# Patient Record
Sex: Male | Born: 1970 | Hispanic: No | Marital: Married | Smoking: Current some day smoker
Health system: Southern US, Community
[De-identification: ages and names within clinical notes are randomized; demographics above are authoritative.]

## PROBLEM LIST (undated history)

## (undated) DIAGNOSIS — E785 Hyperlipidemia, unspecified: Secondary | ICD-10-CM

## (undated) DIAGNOSIS — K219 Gastro-esophageal reflux disease without esophagitis: Secondary | ICD-10-CM

## (undated) DIAGNOSIS — K2970 Gastritis, unspecified, without bleeding: Secondary | ICD-10-CM

## (undated) DIAGNOSIS — H9319 Tinnitus, unspecified ear: Secondary | ICD-10-CM

## (undated) DIAGNOSIS — N529 Male erectile dysfunction, unspecified: Secondary | ICD-10-CM

## (undated) DIAGNOSIS — R9431 Abnormal electrocardiogram [ECG] [EKG]: Secondary | ICD-10-CM

## (undated) DIAGNOSIS — Z8249 Family history of ischemic heart disease and other diseases of the circulatory system: Secondary | ICD-10-CM

## (undated) DIAGNOSIS — M545 Low back pain, unspecified: Secondary | ICD-10-CM

## (undated) DIAGNOSIS — R739 Hyperglycemia, unspecified: Secondary | ICD-10-CM

## (undated) HISTORY — DX: Hyperglycemia, unspecified: R73.9

## (undated) HISTORY — DX: Gastritis, unspecified, without bleeding: K29.70

## (undated) HISTORY — DX: Male erectile dysfunction, unspecified: N52.9

## (undated) HISTORY — DX: Hyperlipidemia, unspecified: E78.5

## (undated) HISTORY — PX: TONSILLECTOMY: SUR1361

## (undated) HISTORY — DX: Gastro-esophageal reflux disease without esophagitis: K21.9

## (undated) HISTORY — DX: Family history of ischemic heart disease and other diseases of the circulatory system: Z82.49

## (undated) HISTORY — DX: Abnormal electrocardiogram (ECG) (EKG): R94.31

## (undated) HISTORY — DX: Low back pain, unspecified: M54.50

## (undated) HISTORY — DX: Tinnitus, unspecified ear: H93.19

---

## 2004-12-27 ENCOUNTER — Ambulatory Visit: Payer: Self-pay | Admitting: Internal Medicine

## 2005-08-22 ENCOUNTER — Ambulatory Visit: Payer: Self-pay | Admitting: Internal Medicine

## 2006-04-19 ENCOUNTER — Ambulatory Visit: Payer: Self-pay | Admitting: Internal Medicine

## 2006-05-16 ENCOUNTER — Ambulatory Visit: Payer: Self-pay | Admitting: Internal Medicine

## 2007-07-12 ENCOUNTER — Ambulatory Visit: Payer: Self-pay | Admitting: Family Medicine

## 2007-08-05 ENCOUNTER — Ambulatory Visit: Payer: Self-pay | Admitting: Internal Medicine

## 2007-08-05 DIAGNOSIS — K219 Gastro-esophageal reflux disease without esophagitis: Secondary | ICD-10-CM

## 2007-08-07 ENCOUNTER — Telehealth: Payer: Self-pay | Admitting: Internal Medicine

## 2007-08-08 ENCOUNTER — Encounter (INDEPENDENT_AMBULATORY_CARE_PROVIDER_SITE_OTHER): Payer: Self-pay | Admitting: *Deleted

## 2007-08-30 ENCOUNTER — Ambulatory Visit: Payer: Self-pay | Admitting: Gastroenterology

## 2007-09-02 ENCOUNTER — Encounter: Payer: Self-pay | Admitting: Internal Medicine

## 2007-09-02 ENCOUNTER — Encounter: Payer: Self-pay | Admitting: Gastroenterology

## 2007-09-02 ENCOUNTER — Ambulatory Visit: Payer: Self-pay | Admitting: Gastroenterology

## 2007-09-06 ENCOUNTER — Encounter: Payer: Self-pay | Admitting: Gastroenterology

## 2007-09-10 ENCOUNTER — Telehealth: Payer: Self-pay | Admitting: Gastroenterology

## 2009-03-01 ENCOUNTER — Ambulatory Visit: Payer: Self-pay | Admitting: Internal Medicine

## 2009-03-05 ENCOUNTER — Ambulatory Visit: Payer: Self-pay | Admitting: Internal Medicine

## 2009-03-08 LAB — CONVERTED CEMR LAB
ALT: 34 units/L (ref 0–53)
Basophils Absolute: 0 10*3/uL (ref 0.0–0.1)
Calcium: 9.1 mg/dL (ref 8.4–10.5)
Cholesterol: 215 mg/dL — ABNORMAL HIGH (ref 0–200)
Creatinine, Ser: 1.1 mg/dL (ref 0.4–1.5)
Direct LDL: 149.1 mg/dL
Eosinophils Absolute: 0.1 10*3/uL (ref 0.0–0.7)
HCT: 43 % (ref 39.0–52.0)
HDL: 37.8 mg/dL — ABNORMAL LOW (ref >39.00)
Hemoglobin: 14.6 g/dL (ref 13.0–17.0)
Lymphs Abs: 2.2 10*3/uL (ref 0.7–4.0)
MCHC: 33.9 g/dL (ref 30.0–36.0)
MCV: 85.6 fL (ref 78.0–100.0)
Neutro Abs: 2 10*3/uL (ref 1.4–7.7)
RDW: 11.8 % (ref 11.5–14.6)
TSH: 1.29 microintl units/mL (ref 0.35–5.50)
Triglycerides: 132 mg/dL (ref 0.0–149.0)

## 2010-06-07 NOTE — Progress Notes (Signed)
Summary: Pt has question about medicine he takes for H.Pylori  Phone Note Call from Patient Call back at [phone]   Call For: Arlyce Dice Complaint: Chest Pain Summary of Call: Pt has questions about medicine he takes for bacteria for h.pylori. Pt has bad taste in mouth constantly. Patient's chart has been requested.  Initial call taken by: Verdell Face,  Sep 10, 2007 2:01 PM  Follow-up for Phone Call        Mesage left for patient to callback. Laureen Ochs LPN  Sep 11, 4538 11:36 AM  Mesage left for patient to callback. Laureen Ochs LPN  Sep 11, 9809 11:58 AM   Additional Follow-up for Phone Call Additional follow up Details #1::        This could be from the prevpak.  Continue if he can tolerate it. Additional Follow-up by: Louis Meckel MD,  Sep 12, 2007 3:48 PM    Additional Follow-up for Phone Call Additional follow up Details #2::    1) Pt. will complete prevpac. 2) Pt. instructed to call back as needed.  Follow-up by: Laureen Ochs LPN,  Sep 11, 9145 4:03 PM

## 2010-07-19 ENCOUNTER — Emergency Department (HOSPITAL_BASED_OUTPATIENT_CLINIC_OR_DEPARTMENT_OTHER)
Admission: EM | Admit: 2010-07-19 | Discharge: 2010-07-19 | Disposition: A | Discharge status: Home / Self Care | Payer: BC Managed Care – PPO | Attending: Emergency Medicine | Admitting: Emergency Medicine

## 2010-07-19 ENCOUNTER — Emergency Department (INDEPENDENT_AMBULATORY_CARE_PROVIDER_SITE_OTHER): Payer: BC Managed Care – PPO

## 2010-07-19 DIAGNOSIS — Y92009 Unspecified place in unspecified non-institutional (private) residence as the place of occurrence of the external cause: Secondary | ICD-10-CM | POA: Insufficient documentation

## 2010-07-19 DIAGNOSIS — S61409A Unspecified open wound of unspecified hand, initial encounter: Secondary | ICD-10-CM | POA: Insufficient documentation

## 2010-07-19 DIAGNOSIS — W268XXA Contact with other sharp object(s), not elsewhere classified, initial encounter: Secondary | ICD-10-CM

## 2010-07-20 ENCOUNTER — Encounter: Payer: Self-pay | Admitting: Internal Medicine

## 2010-07-20 ENCOUNTER — Ambulatory Visit: Payer: BC Managed Care – PPO | Admitting: Internal Medicine

## 2010-07-20 ENCOUNTER — Ambulatory Visit (INDEPENDENT_AMBULATORY_CARE_PROVIDER_SITE_OTHER): Payer: BC Managed Care – PPO | Admitting: Internal Medicine

## 2010-07-20 DIAGNOSIS — S4980XA Other specified injuries of shoulder and upper arm, unspecified arm, initial encounter: Secondary | ICD-10-CM | POA: Insufficient documentation

## 2010-07-25 ENCOUNTER — Encounter: Payer: Self-pay | Admitting: Internal Medicine

## 2010-07-25 ENCOUNTER — Ambulatory Visit (INDEPENDENT_AMBULATORY_CARE_PROVIDER_SITE_OTHER): Payer: BC Managed Care – PPO | Admitting: Internal Medicine

## 2010-07-25 DIAGNOSIS — S4980XA Other specified injuries of shoulder and upper arm, unspecified arm, initial encounter: Secondary | ICD-10-CM

## 2010-07-26 ENCOUNTER — Encounter: Payer: Self-pay | Admitting: Internal Medicine

## 2010-07-26 NOTE — Miscellaneous (Signed)
Summary: Orders Update  Clinical Lists Changes  Orders: Added new Service order of Est. Patient Level II (99212) - Signed 

## 2010-07-26 NOTE — Assessment & Plan Note (Signed)
Summary: Injured lt hand yesterday, went to ER, wants a doctor to look...   Vital Signs:  Patient profile:   40 year old male Weight:      168.6 pounds BMI:     24.99 Temp:     98.3 degrees F oral Pulse rate:   84 / minute Resp:     13 per minute BP sitting:   140 / 80  (right arm) Cuff size:   regular  Vitals Entered By: Shonna Chock CMA (July 20, 2010 10:43 AM) CC: Injured left hand, seen @  the Med center last night   Primary Care Provider:  Drue Novel  CC:  Injured left hand and seen @  the Med center last night.  History of Present Illness:    He cut his LUE on broken glass from storm window. Sutures  & tetanus shot completed in ER  last night about 6 pm 7 antibiotics Rxed. He is to change dressing two times a day ; this am he could  not get dressing off due to adhesion.  Current Medications (verified): 1)  Nexium 40 Mg  Cpdr (Esomeprazole Magnesium) .Marland Kitchen.. 1 By Mouth Before Breakfast (Off/on)  Allergies (verified): No Known Drug Allergies  Review of Systems General:  Denies chills, fever, and sweats.  Physical Exam  General:  well-nourished,in no acute distress; alert,appropriate and cooperative throughout examination Skin:  20X 16  mm clean abrasion  L forearm ; sutured  wound (5.5 cm) clean  Axillary Nodes:  No palpable lymphadenopathy LUE   Impression & Recommendations:  Problem # 1:  ARM INJURY (ICD-959.2) laceration & abrasion  Complete Medication List: 1)  Nexium 40 Mg Cpdr (Esomeprazole magnesium) .Marland Kitchen.. 1 by mouth before breakfast (off/on)  Patient Instructions: 1)  Report "pain , pus , fever & red streaks". Wet to almost dry saline only as needed for inflammation . Cover wounds with Telfa dressing.

## 2010-07-27 ENCOUNTER — Ambulatory Visit (INDEPENDENT_AMBULATORY_CARE_PROVIDER_SITE_OTHER): Payer: BC Managed Care – PPO | Admitting: Internal Medicine

## 2010-07-27 ENCOUNTER — Encounter: Payer: Self-pay | Admitting: Internal Medicine

## 2010-07-27 VITALS — BP 124/80 | HR 78 | Wt 169.4 lb

## 2010-07-27 DIAGNOSIS — S4980XA Other specified injuries of shoulder and upper arm, unspecified arm, initial encounter: Secondary | ICD-10-CM

## 2010-07-27 MED ORDER — MUPIROCIN 2 % EX OINT: TOPICAL_OINTMENT | CUTANEOUS | Status: DC

## 2010-07-27 MED ORDER — DOXYCYCLINE HYCLATE 100 MG PO CAPS: 100.0000 mg | ORAL_CAPSULE | Freq: Two times a day (BID) | ORAL | Status: AC

## 2010-07-27 NOTE — Progress Notes (Signed)
  Subjective:    Patient ID: Scott Sexton, male    DOB: 07-23-1970, 40 y.o.   MRN: [id number]  HPI  Here for suture removal No reported problems   Review of Systems     Objective:   Physical Exam On inspection, the L hand wound seemed fine, we removed all sutures, in the process we noted few drops of purulent-blood  d/c . Surrounding areas are free of cellulitis or crepitus        Assessment & Plan:

## 2010-07-27 NOTE — Patient Instructions (Addendum)
Keep it clean, use mupirocin  twice a day Doxycycline as prescribed Came back in 1 week Call if redness,  swelling, fever

## 2010-07-27 NOTE — Assessment & Plan Note (Addendum)
Wound is infected, pt had a  Td few days ago It was irrigated w/ H2O2 and covered  The wound is open but anticipate will heal slowly See instructions

## 2010-07-29 ENCOUNTER — Ambulatory Visit (INDEPENDENT_AMBULATORY_CARE_PROVIDER_SITE_OTHER): Payer: BC Managed Care – PPO | Admitting: Internal Medicine

## 2010-07-29 ENCOUNTER — Encounter: Payer: Self-pay | Admitting: Internal Medicine

## 2010-07-29 DIAGNOSIS — L089 Local infection of the skin and subcutaneous tissue, unspecified: Secondary | ICD-10-CM

## 2010-07-29 NOTE — Assessment & Plan Note (Signed)
Overall I think he is doing well, no evidence of further infection. I think he is responding well to the treatment. Again, I asked him to call if there is any redness or discharge. Otherwise plan is the same, see previous note

## 2010-07-29 NOTE — Progress Notes (Signed)
  Subjective:    Patient ID: Scott Sexton, male    DOB: 1970/12/02, 40 y.o.   MRN: [id number]  HPI  last night he developed a throbbing pain on the palmar aspect of the left hand   Review of Systems  no fever No further discharge from the area No redness He is following very closely the instructions for wound care    Objective:   Physical Exam  alert oriented in on apparent distress Left arm  : Wounds proximal to the wrist are healing well  large laceration at the side of the hand looks dry, no swollen, no discharge.  inspection of the palmar aspect of the hand showed no abnormalities, swelling, pain, redness.       Assessment & Plan:

## 2010-07-30 ENCOUNTER — Encounter: Payer: Self-pay | Admitting: Internal Medicine

## 2010-08-01 ENCOUNTER — Ambulatory Visit: Payer: BC Managed Care – PPO | Admitting: Internal Medicine

## 2010-08-03 ENCOUNTER — Ambulatory Visit (INDEPENDENT_AMBULATORY_CARE_PROVIDER_SITE_OTHER): Payer: BC Managed Care – PPO | Admitting: Internal Medicine

## 2010-08-03 ENCOUNTER — Encounter: Payer: Self-pay | Admitting: Internal Medicine

## 2010-08-03 DIAGNOSIS — T148XXA Other injury of unspecified body region, initial encounter: Secondary | ICD-10-CM

## 2010-08-03 NOTE — Progress Notes (Signed)
  Subjective:    Patient ID: Scott Sexton, male    DOB: 05-Jun-1970, 40 y.o.   MRN: [id number]  HPI  good medication compliance w/ abx Wound healing well, no d/c    Review of Systems     Objective:   Physical Exam wounds @ the L arm healing w/o apparent problems, no d/c , no redness, good granulation tissue        Assessment & Plan:

## 2010-08-03 NOTE — Assessment & Plan Note (Signed)
Improving Finish po abx Call again if s/s of infection F/u prn otherwise

## 2010-08-04 NOTE — Assessment & Plan Note (Signed)
Summary: remove stitches -went to [facility]   Vital Signs:  Patient profile:   40 year old male Weight:      169.50 pounds Pulse rate:   76 / minute Pulse rhythm:   regular BP sitting:   124 / 84  (left arm) Cuff size:   regular  Vitals Entered By: Army Fossa CMA (July 25, 2010 3:40 PM) CC: Remove stitches from (L) hand Comments currently on ATB unsure of name [facility] [address]    History of Present Illness:  went to the ER 3-13 with a laceration He was sutured up,  they gave a tetanus shot and antibiotics Here for stitch removal  Review of systems Doing well Good medication compliance with antibiotics  pain is manageable No discharge  no problems with moving his fingers  Current Medications (verified): 1)  Nexium 40 Mg  Cpdr (Esomeprazole Magnesium) .Marland Kitchen.. 1 By Mouth Before Breakfast (Off/on)  Allergies (verified): No Known Drug Allergies  Past History:  Past Medical History: Reviewed history from 03/01/2009 and no changes required. GERD EGD 08/2007 : Gastritis, H. pylori positive, status post treatment  Past Surgical History: Reviewed history from 07/12/2007 and no changes required. Tonsillectomy  Physical Exam  General:  alert and well-developed.  alert and well-developed.   Msk:  motor exam hand wnl  Extremities:   the ulnar aspect of the left hand he has a 10 cm laceration with several stitches. No evidence of infection. Proximally he has several excoriations, no evidence of cellulitis. No foreign bodies noted.   Impression & Recommendations:  Problem # 1:  ARM INJURY (ICD-959.2)  seems to be healing  well  Will keep stitches in for 2 additional days, see instructions.  Complete Medication List: 1)  Nexium 40 Mg Cpdr (Esomeprazole magnesium) .Marland Kitchen.. 1 by mouth before breakfast (off/on)  Patient Instructions: 1)  patient needs an appointment for Wednesday afternoon (07-27-10) Prescriptions: NEXIUM 40 MG  CPDR (ESOMEPRAZOLE MAGNESIUM) 1 by  mouth before breakfast (off/on)  #30 x 1   Entered by:   Army Fossa CMA   Authorized by:   Nolon Rod. Paz MD   Signed by:   Army Fossa CMA on 07/25/2010   Method used:   Electronically to        [id number] (retail)       [address]       [place], [place]  [address]       Ph: [phone]       Fax: [phone]   RxID:   [id number]    Orders Added: 1)  Est. Patient Level III [44034]   Immunization History:  Tetanus/Td Immunization History:    Tetanus/Td:  at the er per pt  (07/19/2010)   Immunization History:  Tetanus/Td Immunization History:    Tetanus/Td:  at the ER per pt  (07/19/2010)

## 2010-09-09 ENCOUNTER — Encounter: Payer: Self-pay | Admitting: Internal Medicine

## 2010-09-09 ENCOUNTER — Ambulatory Visit (INDEPENDENT_AMBULATORY_CARE_PROVIDER_SITE_OTHER): Payer: BC Managed Care – PPO | Admitting: Internal Medicine

## 2010-09-09 VITALS — BP 122/76 | HR 92 | Temp 98.2°F | Wt 170.0 lb

## 2010-09-09 DIAGNOSIS — J029 Acute pharyngitis, unspecified: Secondary | ICD-10-CM

## 2010-09-09 NOTE — Assessment & Plan Note (Addendum)
Resolving pharyngitis. no fever, neck LADs, neg strep test. Plan: conservative treatment. See instructions

## 2010-09-09 NOTE — Patient Instructions (Signed)
Rest, fluids , tylenol Zyrtec x few days  Call anytime if the symptoms are severe, you have high fever, more sore throat

## 2010-09-09 NOTE — Progress Notes (Signed)
  Subjective:    Patient ID: Scott Sexton, male    DOB: 23-Dec-1970, 40 y.o.   MRN: [id number]  HPI  ST x 1 week, decided to take zyrtec yesterday and feels better today  Past Medical History  Diagnosis Date  . GERD (gastroesophageal reflux disease)   . Gastritis     EGD 08/2007, H.Pylori positive, status post treatment   Past Surgical History  Procedure Date  . Tonsillectomy      Review of Systems No fever-chills No cough, chest congestion No RN or sinus congestion    Objective:   Physical Exam  Constitutional: He appears well-developed and well-nourished.  HENT:  Head: Normocephalic and atraumatic.  Right Ear: External ear normal.  Left Ear: External ear normal.  Nose: Nose normal.  Mouth/Throat: Oropharynx is clear and moist. No oropharyngeal exudate.  Eyes: EOM are normal.  Neck: Normal range of motion. Neck supple.  Cardiovascular: Normal rate, regular rhythm and normal heart sounds.   No murmur heard. Pulmonary/Chest: Effort normal and breath sounds normal. No respiratory distress. He has no wheezes. He has no rales.  Lymphadenopathy:    He has no cervical adenopathy.          Assessment & Plan:

## 2010-09-21 ENCOUNTER — Telehealth: Payer: Self-pay

## 2010-09-21 ENCOUNTER — Ambulatory Visit (INDEPENDENT_AMBULATORY_CARE_PROVIDER_SITE_OTHER): Payer: BC Managed Care – PPO | Admitting: Internal Medicine

## 2010-09-21 ENCOUNTER — Encounter: Payer: Self-pay | Admitting: Internal Medicine

## 2010-09-21 DIAGNOSIS — H6690 Otitis media, unspecified, unspecified ear: Secondary | ICD-10-CM

## 2010-09-21 NOTE — Telephone Encounter (Signed)
Spoke w/ pt wanted to get rx for atb that what given while he was out of town for ear infection and had lost some of medication and would like to have refill informed pt that he would have to see Dr. Drue Novel if he feels like theres is still an ear infection we can't just prescribe medication and have not documentation.

## 2010-09-21 NOTE — Progress Notes (Signed)
  Subjective:    Patient ID: Scott Sexton, male    DOB: 13-Dec-1970, 40 y.o.   MRN: [id number]  HPI After last OV, gradually got worse, developed hoarsennes and R ear pain. Went to a UC in [address], streap A (-), was Rx 10 days of Amoxicillin , dx w/ OMA. Somehow lost some of the pills, has enough x a total of 7 days .  Past Medical History  Diagnosis Date  . GERD (gastroesophageal reflux disease)   . Gastritis     EGD 08/2007, H.Pylori positive, status post treatment   Past Surgical History  Procedure Date  . Tonsillectomy      Review of Systems No fever, feels a lot better. At present: no ear d/c, pain, no  N-V-D, no ST     Objective:   Physical Exam  Constitutional: He appears well-developed and well-nourished.  HENT:  Head: Normocephalic.  Right Ear: External ear normal.  Left Ear: External ear normal.  Nose: Nose normal.  Mouth/Throat: No oropharyngeal exudate.  Lymphadenopathy:    He has no cervical adenopathy.          Assessment & Plan:

## 2010-09-21 NOTE — Assessment & Plan Note (Signed)
resoving OMA. Rec to finish abx (toatal of 7 days of amoxicillin) then stop. Call if problems

## 2011-04-14 ENCOUNTER — Other Ambulatory Visit: Payer: Self-pay | Admitting: Internal Medicine

## 2011-09-06 ENCOUNTER — Ambulatory Visit (INDEPENDENT_AMBULATORY_CARE_PROVIDER_SITE_OTHER): Payer: BC Managed Care – PPO | Admitting: Internal Medicine

## 2011-09-06 ENCOUNTER — Encounter: Payer: Self-pay | Admitting: Internal Medicine

## 2011-09-06 ENCOUNTER — Other Ambulatory Visit: Payer: Self-pay | Admitting: Internal Medicine

## 2011-09-06 DIAGNOSIS — Z0000 Encounter for general adult medical examination without abnormal findings: Secondary | ICD-10-CM | POA: Insufficient documentation

## 2011-09-06 DIAGNOSIS — N5082 Scrotal pain: Secondary | ICD-10-CM

## 2011-09-06 DIAGNOSIS — K219 Gastro-esophageal reflux disease without esophagitis: Secondary | ICD-10-CM

## 2011-09-06 LAB — POCT URINALYSIS DIPSTICK
Bilirubin, UA: NEGATIVE
Blood, UA: NEGATIVE
Glucose, UA: NEGATIVE
Nitrite, UA: NEGATIVE
Spec Grav, UA: 1.015

## 2011-09-06 NOTE — Assessment & Plan Note (Addendum)
Td  07-2010 at the ER Left epididymal cyst? Has a ill-defined scrotal discomfort.  Plan: Ultrasound of the scrotum, discussed   self testicular exam Labs Continue with a healthy diet, exercise discussed !

## 2011-09-06 NOTE — Assessment & Plan Note (Signed)
Well controlled on PRN PPIs

## 2011-09-06 NOTE — Progress Notes (Signed)
  Subjective:    Patient ID: Scott Sexton, male    DOB: [birth year], 41 y.o.   MRN: [id number]  HPI CPX  Past Medical History: GERD EGD 08/2007 : Gastritis, H. 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   Cardiovascular: Negative for chest pain and palpitations.  Gastrointestinal: Negative for abdominal pain and blood in stool.       GERD, on nexium prn, doing well  Genitourinary: Negative for dysuria, hematuria and difficulty urinating.       Ill defined scrotal pain , happened few times (x3 times in 6 months), does not do STE       Objective:   Physical Exam  General -- alert, well-developed, and well-nourished.   Neck --no thyromegaly , normal carotid pulse Lungs -- normal respiratory effort, no intercostal retractions, no accessory muscle use, and normal breath sounds.   Heart-- normal rate, regular rhythm, no murmur, and no gallop.   Abdomen--soft, non-tender, no distention, no masses, no HSM, no guarding, and no rigidity.   Extremities-- no pretibial edema bilaterally GU.-- normal penis, no lesions or d/c. Testicles normal, distal L epididymal cyst? Neurologic-- alert & oriented X3 and strength normal in all extremities. Psych-- Cognition and judgment appear intact. Alert and cooperative with normal attention span and concentration.  not anxious appearing and not depressed appearing.       Assessment & Plan:

## 2011-09-06 NOTE — Patient Instructions (Signed)
Come back fasting column FLP CMP CBC TSH--- dx v70

## 2011-09-07 ENCOUNTER — Other Ambulatory Visit (INDEPENDENT_AMBULATORY_CARE_PROVIDER_SITE_OTHER): Payer: BC Managed Care – PPO

## 2011-09-07 DIAGNOSIS — Z0000 Encounter for general adult medical examination without abnormal findings: Secondary | ICD-10-CM

## 2011-09-07 DIAGNOSIS — Z13220 Encounter for screening for lipoid disorders: Secondary | ICD-10-CM

## 2011-09-07 LAB — COMPREHENSIVE METABOLIC PANEL
Alkaline Phosphatase: 32 U/L — ABNORMAL LOW (ref 39–117)
Creatinine, Ser: 1.1 mg/dL (ref 0.4–1.5)
Glucose, Bld: 105 mg/dL — ABNORMAL HIGH (ref 70–99)
Sodium: 138 mEq/L (ref 135–145)
Total Bilirubin: 1 mg/dL (ref 0.3–1.2)
Total Protein: 7.5 g/dL (ref 6.0–8.3)

## 2011-09-07 LAB — LDL CHOLESTEROL, DIRECT: Direct LDL: 151.6 mg/dL

## 2011-09-07 LAB — CBC WITH DIFFERENTIAL/PLATELET
Basophils Relative: 0.7 % (ref 0.0–3.0)
Eosinophils Absolute: 0.1 10*3/uL (ref 0.0–0.7)
Eosinophils Relative: 3.1 % (ref 0.0–5.0)
Hemoglobin: 14.3 g/dL (ref 13.0–17.0)
Lymphocytes Relative: 56 % — ABNORMAL HIGH (ref 12.0–46.0)
MCHC: 33.4 g/dL (ref 30.0–36.0)
Neutro Abs: 1.5 10*3/uL (ref 1.4–7.7)
Neutrophils Relative %: 33.7 % — ABNORMAL LOW (ref 43.0–77.0)
RBC: 5.09 Mil/uL (ref 4.22–5.81)
WBC: 4.4 10*3/uL — ABNORMAL LOW (ref 4.5–10.5)

## 2011-09-07 LAB — LIPID PANEL
Cholesterol: 214 mg/dL — ABNORMAL HIGH (ref 0–200)
HDL: 47.8 mg/dL (ref >39.00)
Triglycerides: 162 mg/dL — ABNORMAL HIGH (ref 0.0–149.0)

## 2011-09-07 NOTE — Progress Notes (Signed)
Lab only 

## 2011-09-08 ENCOUNTER — Ambulatory Visit
Admission: RE | Admit: 2011-09-08 | Discharge: 2011-09-08 | Disposition: A | Discharge status: Home / Self Care | Payer: BC Managed Care – PPO | Source: Ambulatory Visit | Attending: Internal Medicine | Admitting: Internal Medicine

## 2011-09-08 DIAGNOSIS — N5082 Scrotal pain: Secondary | ICD-10-CM

## 2011-09-11 ENCOUNTER — Encounter: Payer: Self-pay | Admitting: *Deleted

## 2012-01-01 ENCOUNTER — Telehealth: Payer: Self-pay | Admitting: Internal Medicine

## 2012-01-01 MED ORDER — ESOMEPRAZOLE MAGNESIUM 40 MG PO CPDR: 40.0000 mg | DELAYED_RELEASE_CAPSULE | Freq: Every day | ORAL | Status: DC

## 2012-01-01 NOTE — Telephone Encounter (Signed)
Refill: Nexium 40mg  capsule. Qty 90

## 2012-01-01 NOTE — Telephone Encounter (Signed)
Refill done.  

## 2012-01-05 MED ORDER — ESOMEPRAZOLE MAGNESIUM 40 MG PO CPDR: 40.0000 mg | DELAYED_RELEASE_CAPSULE | Freq: Every day | ORAL | Status: DC

## 2012-01-05 NOTE — Telephone Encounter (Signed)
Refill sent to wrong pharmacy. Re-sent rx to [facility].

## 2012-01-05 NOTE — Addendum Note (Signed)
Addended by: Edwena Felty T on: 01/05/2012 02:21 PM   Modules accepted: Orders

## 2012-05-23 ENCOUNTER — Encounter: Payer: Self-pay | Admitting: Family Medicine

## 2012-05-23 ENCOUNTER — Ambulatory Visit (INDEPENDENT_AMBULATORY_CARE_PROVIDER_SITE_OTHER): Payer: BC Managed Care – PPO | Admitting: Family Medicine

## 2012-05-23 VITALS — BP 112/70 | HR 76 | Temp 98.2°F | Wt 166.8 lb

## 2012-05-23 DIAGNOSIS — J029 Acute pharyngitis, unspecified: Secondary | ICD-10-CM

## 2012-05-23 NOTE — Progress Notes (Signed)
  Subjective:     Scott Sexton is a 42 y.o. male who presents for evaluation of sore throat. Associated symptoms include post nasal drip, productive cough, sore throat and runny nose with clear drainage.. Onset of symptoms was 8 days ago, and have been unchanged since that time. He is drinking plenty of fluids. He has not had a recent close exposure to someone with proven streptococcal pharyngitis.---but was exposed to flu-- he went to dr after Adline Potter of symptoms and was told it was too late to treat with tamiflu but they gave it to him anyway.  He did not take it.   The following portions of the patient's history were reviewed and updated as appropriate: allergies, current medications, past family history, past medical history, past social history, past surgical history and problem list.  Review of Systems Pertinent items are noted in HPI.    Objective:    BP 112/70  Pulse 76  Temp 98.2 F (36.8 C) (Oral)  Wt 166 lb 12.8 oz (75.66 kg)  SpO2 97% General appearance: alert, cooperative, appears stated age and no distress Head: Normocephalic, without obvious abnormality, atraumatic Ears: normal TM's and external ear canals both ears Nose: clear discharge, mild congestion, turbinates red, swollen Throat: abnormal findings: mild oropharyngeal erythema and no exudates,  + PND Neck: no adenopathy, supple, symmetrical, trachea midline and thyroid not enlarged, symmetric, no tenderness/mass/nodules Lungs: clear to auscultation bilaterally Extremities: extremities normal, atraumatic, no cyanosis or edema  Laboratory Strep test done. Results:negative.    Assessment:    Acute pharyngitis, likely  Viral pharyngitis.    Plan:    Use of OTC analgesics recommended as well as salt water gargles. Follow up as needed. antihistamines otc  Throat culture done

## 2012-05-23 NOTE — Patient Instructions (Addendum)

## 2012-05-26 LAB — CULTURE, GROUP A STREP: Organism ID, Bacteria: NORMAL

## 2012-07-17 ENCOUNTER — Other Ambulatory Visit: Payer: Self-pay | Admitting: *Deleted

## 2012-07-17 DIAGNOSIS — K219 Gastro-esophageal reflux disease without esophagitis: Secondary | ICD-10-CM

## 2012-07-17 MED ORDER — ESOMEPRAZOLE MAGNESIUM 40 MG PO CPDR: 40.0000 mg | DELAYED_RELEASE_CAPSULE | Freq: Every day | ORAL | Status: DC

## 2012-07-17 NOTE — Telephone Encounter (Signed)
[facility]

## 2012-11-29 ENCOUNTER — Encounter: Payer: Self-pay | Admitting: Family Medicine

## 2012-11-29 ENCOUNTER — Ambulatory Visit (INDEPENDENT_AMBULATORY_CARE_PROVIDER_SITE_OTHER): Payer: BC Managed Care – PPO | Admitting: Family Medicine

## 2012-11-29 VITALS — BP 118/70 | HR 75 | Temp 98.5°F | Wt 168.2 lb

## 2012-11-29 DIAGNOSIS — S61209A Unspecified open wound of unspecified finger without damage to nail, initial encounter: Secondary | ICD-10-CM

## 2012-11-29 DIAGNOSIS — W540XXA Bitten by dog, initial encounter: Secondary | ICD-10-CM

## 2012-11-29 DIAGNOSIS — S61259A Open bite of unspecified finger without damage to nail, initial encounter: Secondary | ICD-10-CM

## 2012-11-29 NOTE — Patient Instructions (Addendum)
Keep area clean and dry Apply Neosporin twice daily If spreading redness, pain, swelling, or drainage- please call Call with any questions or concerns Hang in there!!!

## 2012-11-29 NOTE — Progress Notes (Signed)
  Subjective:    Patient ID: Scott Sexton, male    DOB: 09-Aug-1970, 42 y.o.   MRN: [id number]  HPI Dog bite- R middle finger, occurred 2 days ago.  Known dog- UTD on shots.  Pt is UTD on tetanus (2012).  No redness or drainage from finger.  No swelling.  No pain other than at site of scrapes.   Review of Systems For ROS see HPI     Objective:   Physical Exam  Vitals reviewed. Constitutional: He appears well-developed and well-nourished. No distress.  Skin: Skin is warm and dry.  3 separate areas of superficial abrasions on R middle finger.  No puncture wounds.  No drainage, no surrounding redness, no induration.          Assessment & Plan:

## 2012-11-29 NOTE — Assessment & Plan Note (Signed)
New.  No evidence of wound other than superficial scrapes.  UTD on tetanus.  No infection present.  Encouraged neosporin.  No need for oral abx.  Reviewed supportive care and red flags that should prompt return.  Pt expressed understanding and is in agreement w/ plan.

## 2012-12-24 ENCOUNTER — Ambulatory Visit (INDEPENDENT_AMBULATORY_CARE_PROVIDER_SITE_OTHER): Payer: BC Managed Care – PPO | Admitting: Internal Medicine

## 2012-12-24 ENCOUNTER — Encounter: Payer: Self-pay | Admitting: Internal Medicine

## 2012-12-24 VITALS — BP 110/78 | HR 68 | Temp 98.2°F | Wt 166.6 lb

## 2012-12-24 DIAGNOSIS — Z0000 Encounter for general adult medical examination without abnormal findings: Secondary | ICD-10-CM

## 2012-12-24 DIAGNOSIS — I861 Scrotal varices: Secondary | ICD-10-CM | POA: Insufficient documentation

## 2012-12-24 NOTE — Patient Instructions (Addendum)
Please come back fasting FLP, CMP, CBC, A1c-- dx v70 --- Next visit in 1 year for a physical exam  Please make an appointment before you leave the office today (or call few weeks in advance)     Testicular Problems and Self-Exam Men can examine themselves easily and effectively with positive results. Monthly exams detect problems early and save lives. There are numerous causes of swelling in the testicle. Testicular cancer usually appears as a firm painless lump in the front part of the testicle. This may feel like a dull ache or heavy feeling located in the lower abdomen (belly), groin, or scrotum.  The risk is greater in men with undescended testicles and it is more common in young men. It is responsible for almost a fifth of cancers in males between ages 27 and 32. Other common causes of swellings, lumps, and testicular pain include injuries, inflammation (soreness) from infection, hydrocele, and torsion. These are a few of the reasons to do monthly self-examination of the testicles. The exam only takes minutes and could add years to your life. Get in the habit! SELF-EXAMINATION OF THE TESTICLES The testicles are easiest to examine after warm baths or showers and are more difficult to examine when you are cold. This is because the muscles attached to the testicles retract and pull them up higher or into the abdomen. While standing, roll one testicle between the thumb and forefinger. Feel for lumps, swelling, or discomfort. A normal testicle is egg shaped and feels firm. It is smooth and not tender. The spermatic cord can be felt as a firm spaghetti-like cord at the back of the testicle. It is also important to examine your groins. This is the crease between the front of your leg and your abdomen. Also, feel for enlarged lymph nodes (glands). Enlarged nodes are also a cause for you to see your caregiver for evaluation.  Self-examination of the testicles and groin areas on a regular basis will help you  to know what your own testicles and groins feel like. This will help you pick up an abnormality (difference) at an earlier stage. Early discovery is the key to curing this cancer or treating other conditions. Any lump, change, or swelling in the testicle calls for immediate evaluation by your caregiver. Cancer of the testicle does not result in impotence and it does not prevent normal intercourse or prevent having children. If your caregiver feels that medical treatment or chemotherapy could lead to infertility, sperm can be frozen for future use. It is necessary to see a caregiver as soon as possible after the discovery of a lump in a testicle. Document Released: 07/31/2000 Document Revised: 07/17/2011 Document Reviewed: 04/25/2008 [facility] Patient Information 2014 [place], [place].

## 2012-12-24 NOTE — Progress Notes (Signed)
  Subjective:    Patient ID: Scott Sexton, male    DOB: [birth year], 42 y.o.   MRN: [id number]  HPI CPX  Past Medical History  Diagnosis Date  . GERD (gastroesophageal reflux disease)   . Gastritis     EGD 08/2007, H.Pylori positive, status post treatment   Past Surgical History  Procedure Laterality Date  . Tonsillectomy     History   Social History  . Marital Status: Married    Spouse Name: N/A    Number of Children: 1  . Years of Education: N/A   Occupational History  . Acupuncturist    Social History Main Topics  . Smoking status: Never Smoker   . Smokeless tobacco: Never Used  . Alcohol Use: Yes     Comment: socially  . Drug Use: No  . Sexual Activity: Not on file   Other Topics Concern  . Not on file   Social History Narrative   Originally from [place],    Married, 1 son, boy, 2005       Family History  Problem Relation Age of Onset  . Diabetes Mother   . Hypertension Mother   . Coronary artery disease Mother     onset in her 68s, had an angioplasty in 75  . Coronary artery disease      uncle  . Stroke Mother     ?  . Colon cancer Neg Hx   . Prostate cancer Neg Hx      Review of Systems Exercise-- very little  Diet-- healthy   Denies chest pain or shortness or breath Note nausea, vomiting, diarrhea blood in the stools. No dysuria or gross hematuria. Very rarely has a ill-defined discomfort in the left scrotum, no swelling. Occasionally feels fatigued, admits to rare snoring.    Objective:   Physical Exam BP 110/78  Pulse 68  Temp(Src) 98.2 F (36.8 C) (Oral)  Wt 166 lb 9.6 oz (75.569 kg)  BMI 24.59 kg/m2  SpO2 95% General -- alert, well-developed, NAD.  Neck --no thyromegaly Lungs -- normal respiratory effort, no intercostal retractions, no accessory muscle use, and normal breath sounds.  Heart-- normal rate, regular rhythm, no murmur.  Abdomen-- Not distended, Good bowel sounds,soft, non-tender.No mass GU-- No inguinal hernias,  penis normal, scrotal contents normal, left epididymus  slightly more sensitive than the right Extremities-- no pretibial edema bilaterally  Neurologic-- alert & oriented X3. Speech, gait normal.\ Psych-- Cognition and judgment appear intact. Alert and cooperative with normal attention span and concentration. not anxious appearing and not depressed appearing.      Assessment & Plan:

## 2012-12-24 NOTE — Assessment & Plan Note (Addendum)
Td  07-2010 at the ER STE Labs Continue with a healthy diet, increase exercise  . Occasionally feels fatigued, we are checking labs,if labs are normal and he has severe fatigue will need further eval.

## 2012-12-24 NOTE — Assessment & Plan Note (Signed)
L varicocele per u/s 2013, occ dyscomfort, rec observation, STE

## 2013-07-21 ENCOUNTER — Other Ambulatory Visit: Payer: Self-pay | Admitting: Internal Medicine

## 2013-07-21 DIAGNOSIS — K219 Gastro-esophageal reflux disease without esophagitis: Secondary | ICD-10-CM

## 2013-07-22 NOTE — Telephone Encounter (Signed)
Refill for Nexium sent to [facility]

## 2013-10-15 ENCOUNTER — Encounter (HOSPITAL_COMMUNITY): Payer: Self-pay | Admitting: Emergency Medicine

## 2013-10-15 ENCOUNTER — Emergency Department (HOSPITAL_COMMUNITY)
Admission: EM | Admit: 2013-10-15 | Discharge: 2013-10-16 | Disposition: A | Discharge status: Home / Self Care | Payer: BC Managed Care – PPO | Attending: Emergency Medicine | Admitting: Emergency Medicine

## 2013-10-15 DIAGNOSIS — Y92009 Unspecified place in unspecified non-institutional (private) residence as the place of occurrence of the external cause: Secondary | ICD-10-CM | POA: Insufficient documentation

## 2013-10-15 DIAGNOSIS — S0181XA Laceration without foreign body of other part of head, initial encounter: Secondary | ICD-10-CM

## 2013-10-15 DIAGNOSIS — W268XXA Contact with other sharp object(s), not elsewhere classified, initial encounter: Secondary | ICD-10-CM | POA: Insufficient documentation

## 2013-10-15 DIAGNOSIS — S0180XA Unspecified open wound of other part of head, initial encounter: Secondary | ICD-10-CM | POA: Insufficient documentation

## 2013-10-15 DIAGNOSIS — Y9389 Activity, other specified: Secondary | ICD-10-CM | POA: Insufficient documentation

## 2013-10-15 DIAGNOSIS — K219 Gastro-esophageal reflux disease without esophagitis: Secondary | ICD-10-CM | POA: Insufficient documentation

## 2013-10-15 NOTE — ED Provider Notes (Signed)
CSN: [id number]     Arrival date & time 10/15/13  2102 History  This chart was scribed for Abigail Butts, PA-C, non-physician practitioner working with Threasa Beards, MD by Vernell Barrier, ED scribe. This patient was seen in room TR09C/TR09C and the patient's care was started at 10:55 PM.    Chief Complaint  Patient presents with  . Facial Laceration   The history is provided by the patient and medical records. No language interpreter was used.   HPI Comments: Scott Sexton is a 43 y.o. male who presents to the Emergency Department with a laceration to the left medial eyebrow; occuring approximately 2.5 hours ago; bleeding controlled. No LOC. States he was trying to pull a nail out of a wall and smacked himself in the face with the butt of a hammer. Tetanus UTD. No hx of diabetes. Denies visual disturbance, no headache.  Past Medical History  Diagnosis Date  . GERD (gastroesophageal reflux disease)   . Gastritis     EGD 08/2007, H.Pylori positive, status post treatment   Past Surgical History  Procedure Laterality Date  . Tonsillectomy     Family History  Problem Relation Age of Onset  . Diabetes Mother   . Hypertension Mother   . Coronary artery disease Mother     onset in her 80s, had an angioplasty in 45  . Coronary artery disease      uncle  . Stroke Mother     ?  . Colon cancer Neg Hx   . Prostate cancer Neg Hx    History  Substance Use Topics  . Smoking status: Never Smoker   . Smokeless tobacco: Never Used  . Alcohol Use: Yes     Comment: socially    Review of Systems  Constitutional: Negative for fever.  Eyes: Negative for visual disturbance.  Gastrointestinal: Negative for nausea and vomiting.  Musculoskeletal: Negative for back pain and neck pain.  Skin: Positive for wound.  Allergic/Immunologic: Negative for immunocompromised state.  Neurological: Negative for weakness, numbness and headaches.  Hematological: Does not bruise/bleed easily.   Psychiatric/Behavioral: The patient is not nervous/anxious.   All other systems reviewed and are negative.  Allergies  Review of patient's allergies indicates no known allergies.  Home Medications   Prior to Admission medications   Medication Sig Start Date End Date Taking? Authorizing Provider  cephALEXin (KEFLEX) 500 MG capsule Take 1 capsule (500 mg total) by mouth 4 (four) times daily. 10/16/13   Cathline Dowen, PA-C  NEXIUM 40 MG capsule TAKE 1 BY MOUTH DAILY BEFORE Thornton, MD   Triage vitals: BP 132/83  Pulse 75  Temp(Src) 98.8 F (37.1 C) (Oral)  Ht 5\' 8"  (1.727 m)  Wt 165 lb (74.844 kg)  BMI 25.09 kg/m2  SpO2 98% Physical Exam  Nursing note and vitals reviewed. Constitutional: He is oriented to person, place, and time. He appears well-developed and well-nourished. No distress.  HENT:  Head: Normocephalic and atraumatic.  Nose: Nose normal.  Mouth/Throat: Oropharynx is clear and moist.  Eyes: Conjunctivae and EOM are normal. Pupils are equal, round, and reactive to light. No scleral icterus.  No horizontal, vertical or rotational nystagmus No injection of the conjunctiva of the left eye Full extraocular movements without pain or diplopia  Neck: Normal range of motion. Neck supple.  Full active and passive ROM without pain No midline or paraspinal tenderness No nuchal rigidity or meningeal signs  Cardiovascular: Normal rate, regular rhythm, normal heart sounds  and intact distal pulses.   No murmur heard. Capillary refill < 3 sec  Pulmonary/Chest: Effort normal and breath sounds normal. No respiratory distress.  Musculoskeletal: Normal range of motion. He exhibits no edema.  Lymphadenopathy:    He has no cervical adenopathy.  Neurological: He is alert and oriented to person, place, and time. He has normal reflexes. No cranial nerve deficit. He exhibits normal muscle tone. Coordination normal.  Mental Status:  Alert, oriented, thought content  appropriate. Speech fluent without evidence of aphasia. Able to follow 2 step commands without difficulty.  Cranial Nerves:  II:  Peripheral visual fields grossly normal, pupils equal, round, reactive to light III,IV, VI: ptosis not present, extra-ocular motions intact bilaterally  V,VII: smile symmetric, facial light touch sensation equal VIII: hearing grossly normal bilaterally  IX,X: gag reflex present  XI: bilateral shoulder shrug equal and strong XII: midline tongue extension  Motor:  5/5 in upper and lower extremities bilaterally including strong and equal grip strength and dorsiflexion/plantar flexion Sensory: Pinprick and light touch normal in all extremities.  Deep Tendon Reflexes: 2+ and symmetric  Cerebellar: normal finger-to-nose with bilateral upper extremities Gait: normal gait and balance CV: distal pulses palpable throughout   Skin: Skin is warm and dry. No rash noted. He is not diaphoretic.  3 cm laceration to the left eyebrow. Minimal swelling. Hemostasis achieved. No pain to palpation of globe or orbit. PERRL. EOM intact without pain or diplopia.  Psychiatric: He has a normal mood and affect. His behavior is normal. Judgment and thought content normal.    ED Course  Procedures (including critical care time) DIAGNOSTIC STUDIES: Oxygen Saturation is 98% on room air, normal by my interpretation.    COORDINATION OF CARE: At 10:54 PM: Discussed treatment plan with patient which includes laceration repair. Patient agrees.    LACERATION REPAIR PROCEDURE NOTE The patient's identification was confirmed and consent was obtained. This procedure was performed by Abigail Butts, PA-C at 11:45 PM. Site: Left medial eyebrow Sterile procedures observed Anesthetic used (type and amt): 2cc 2% lidocaine without epi Suture type/size: 4-0 prolene Length: 3 cm # of Sutures: 4 Technique: simple interrupted Complexity: simple Antibx ointment applied Tetanus UTD. Site  anesthetized, irrigated with NS, explored without evidence of foreign body, wound well approximated, site covered with dry, sterile dressing.  Patient tolerated procedure well without complications. Instructions for care discussed verbally and patient provided with additional written instructions for homecare and f/u.   Labs Reviewed - No data to display  Imaging Review No results found.   EKG Interpretation None      MDM   Final diagnoses:  Laceration of face    Clemence Shartzer presents with laceration to the ED with laceration to the left eyebrow.  Neurologically intact. Doubt head injury. Tdap UTD.  Pressure irrigation performed. Laceration occurred < 8 hours prior to repair which was well tolerated. Pt has no co morbidities to effect normal wound healing. Discussed suture home care w pt and answered questions. Pt to f-u for wound check and suture removal in 5 days. Pt is hemodynamically stable w no complaints prior to dc.    I have personally reviewed patient's vitals, nursing note and any pertinent labs or imaging.  I performed an undressed physical exam.    At this time, it has been determined that no acute conditions requiring further emergency intervention. The patient/guardian have been advised of the diagnosis and plan. I reviewed all labs and imaging including any potential incidental findings. We have  discussed signs and symptoms that warrant return to the ED, such as redness, purulent drainage.  Patient/guardian has voiced understanding and agreed to follow-up with the PCP or specialist in 5 days for suture removal.  Vital signs are stable at discharge.   BP 132/83  Pulse 75  Temp(Src) 98.8 F (37.1 C) (Oral)  Ht 5\' 8"  (1.727 m)  Wt 165 lb (74.844 kg)  BMI 25.09 kg/m2  SpO2 98%  I personally performed the services described in this documentation, which was scribed in my presence. The recorded information has been reviewed and is accurate.     Jarrett Soho Vaniah Chambers,  PA-C 10/16/13 0013

## 2013-10-15 NOTE — ED Notes (Signed)
Patient here with complaint of left eye brow laceration. States he was hit by Astronomer while working on project at home. Currently bleeding is controlled. Laceration appears to be about 1" perpendicular to eyebrow nearest to medial line of face.

## 2013-10-16 MED ORDER — CEPHALEXIN 500 MG PO CAPS: 500.0000 mg | ORAL_CAPSULE | Freq: Four times a day (QID) | ORAL | Status: DC

## 2013-10-16 NOTE — Discharge Instructions (Signed)
1. Medications: keflex, usual home medications 2. Treatment: rest, drink plenty of fluids, keep wound clean with warm soap and water, keep bandages dry 3. Follow Up: Please followup with your primary doctor for wound check and suture removal in 5 days; Return to emergency department for erythema or purulent drainage   Facial Laceration  A facial laceration is a cut on the face. These injuries can be painful and cause bleeding. Lacerations usually heal quickly, but they need special care to reduce scarring. DIAGNOSIS  Your health care provider will take a medical history, ask for details about how the injury occurred, and examine the wound to determine how deep the cut is. TREATMENT  Some facial lacerations may not require closure. Others may not be able to be closed because of an increased risk of infection. The risk of infection and the chance for successful closure will depend on various factors, including the amount of time since the injury occurred. The wound may be cleaned to help prevent infection. If closure is appropriate, pain medicines may be given if needed. Your health care provider will use stitches (sutures), wound glue (adhesive), or skin adhesive strips to repair the laceration. These tools bring the skin edges together to allow for faster healing and a better cosmetic outcome. If needed, you may also be given a tetanus shot. HOME CARE INSTRUCTIONS  Only take over-the-counter or prescription medicines as directed by your health care provider.  Follow your health care provider's instructions for wound care. These instructions will vary depending on the technique used for closing the wound. For Sutures:  Keep the wound clean and dry.   If you were given a bandage (dressing), you should change it at least once a day. Also change the dressing if it becomes wet or dirty, or as directed by your health care provider.   Wash the wound with soap and water 2 times a day. Rinse the  wound off with water to remove all soap. Pat the wound dry with a clean towel.   After cleaning, apply a thin layer of the antibiotic ointment recommended by your health care provider. This will help prevent infection and keep the dressing from sticking.   You may shower as usual after the first 24 hours. Do not soak the wound in water until the sutures are removed.   Get your sutures removed as directed by your health care provider. With facial lacerations, sutures should usually be taken out after 4 5 days to avoid stitch marks.   Wait a few days after your sutures are removed before applying any makeup. For Skin Adhesive Strips:  Keep the wound clean and dry.   Do not get the skin adhesive strips wet. You may bathe carefully, using caution to keep the wound dry.   If the wound gets wet, pat it dry with a clean towel.   Skin adhesive strips will fall off on their own. You may trim the strips as the wound heals. Do not remove skin adhesive strips that are still stuck to the wound. They will fall off in time.  For Wound Adhesive:  You may briefly wet your wound in the shower or bath. Do not soak or scrub the wound. Do not swim. Avoid periods of heavy sweating until the skin adhesive has fallen off on its own. After showering or bathing, gently pat the wound dry with a clean towel.   Do not apply liquid medicine, cream medicine, ointment medicine, or makeup to your wound while  the skin adhesive is in place. This may loosen the film before your wound is healed.   If a dressing is placed over the wound, be careful not to apply tape directly over the skin adhesive. This may cause the adhesive to be pulled off before the wound is healed.   Avoid prolonged exposure to sunlight or tanning lamps while the skin adhesive is in place.  The skin adhesive will usually remain in place for 5 10 days, then naturally fall off the skin. Do not pick at the adhesive film.  After Healing: Once  the wound has healed, cover the wound with sunscreen during the day for 1 full year. This can help minimize scarring. Exposure to ultraviolet light in the first year will darken the scar. It can take 1 2 years for the scar to lose its redness and to heal completely.  SEEK IMMEDIATE MEDICAL CARE IF:  You have redness, pain, or swelling around the wound.   You see ayellowish-white fluid (pus) coming from the wound.   You have chills or a fever.  MAKE SURE YOU:  Understand these instructions.  Will watch your condition.  Will get help right away if you are not doing well or get worse. Document Released: 06/01/2004 Document Revised: 02/12/2013 Document Reviewed: 12/05/2012 [facility] Patient Information 2014 [place], [place].

## 2013-10-16 NOTE — ED Notes (Signed)
Declined W/C at D/C and was escorted to lobby by RN. 

## 2013-10-16 NOTE — ED Provider Notes (Signed)
Medical screening examination/treatment/procedure(s) were performed by non-physician practitioner and as supervising physician I was immediately available for consultation/collaboration.   EKG Interpretation None       Threasa Beards, MD 10/16/13 1500

## 2013-10-21 ENCOUNTER — Ambulatory Visit (INDEPENDENT_AMBULATORY_CARE_PROVIDER_SITE_OTHER): Payer: BC Managed Care – PPO | Admitting: Internal Medicine

## 2013-10-21 ENCOUNTER — Encounter: Payer: Self-pay | Admitting: Internal Medicine

## 2013-10-21 VITALS — BP 110/70 | HR 86 | Temp 98.5°F | Wt 165.0 lb

## 2013-10-21 DIAGNOSIS — J069 Acute upper respiratory infection, unspecified: Secondary | ICD-10-CM

## 2013-10-21 DIAGNOSIS — IMO0002 Reserved for concepts with insufficient information to code with codable children: Secondary | ICD-10-CM

## 2013-10-21 DIAGNOSIS — T148XXA Other injury of unspecified body region, initial encounter: Secondary | ICD-10-CM

## 2013-10-21 NOTE — Patient Instructions (Addendum)
Call if you have any problems with the wound after we removed the stitches. Keep the area clean, dry. Okay to use an antibiotic ointment for a few days if needed.   For the cold: Rest, fluids , tylenol For cough, take Mucinex DM twice a day as needed  For congestion use OTC Nasocort: 2 nasal sprays on each side of the nose daily until you feel better  Call if no better in few days Call anytime if the symptoms are severe

## 2013-10-21 NOTE — Progress Notes (Signed)
   Subjective:    Patient ID: Bosie Clos, male    DOB: [birth year], 43 y.o.   MRN: [id number]  DOS:  10/21/2013 Type of  Visit: ER followup History: Stitches were placed @ the ER for a left eyebrow laceration. No apparent complications, no discharge, redness or pain. Also has developed a cold for the last 5 or 6 days mostly runny nose and sore throat.   ROS  Denies fever or chills No cough, chest congestion or sputum production  Past Medical History  Diagnosis Date  . GERD (gastroesophageal reflux disease)   . Gastritis     EGD 08/2007, H.Pylori positive, status post treatment    Past Surgical History  Procedure Laterality Date  . Tonsillectomy      History   Social History  . Marital Status: Married    Spouse Name: N/A    Number of Children: 1  . Years of Education: N/A   Occupational History  . Art gallery manager    Social History Main Topics  . Smoking status: Never Smoker   . Smokeless tobacco: Never Used  . Alcohol Use: Yes     Comment: socially  . Drug Use: No  . Sexual Activity: Not on file   Other Topics Concern  . Not on file   Social History Narrative   Originally from [place],    Married, 1 son, boy, 2005            Medication List       This list is accurate as of: 10/21/13 11:59 PM.  Always use your most recent med list.               cephALEXin 500 MG capsule  Commonly known as:  KEFLEX  Take 1 capsule (500 mg total) by mouth 4 (four) times daily.     NEXIUM 40 MG capsule  Generic drug:  esomeprazole  TAKE 1 BY MOUTH DAILY BEFORE BREAKFAST           Objective:   Physical Exam BP 110/70  Pulse 86  Temp(Src) 98.5 F (36.9 C) (Oral)  Wt 165 lb (74.844 kg)  SpO2 100% General -- alert, well-developed, NAD.  Skin-- Has a healing laceration of the left eyebrow, no red, no discharge HEENT-- Not pale. TMs normal, throat symmetric, no redness or discharge. Face symmetric, sinuses not tender to palpation. Nose not congested.  Lungs -- normal respiratory effort, no intercostal retractions, no accessory muscle use, and normal breath sounds.  Heart-- normal rate, regular rhythm, no murmur.   Extremities-- no pretibial edema bilaterally  Neurologic--  alert & oriented X3. Speech normal, gait appropriate for age, strength symmetric and appropriate for age.   Psych-- Cognition and judgment appear intact. Cooperative with normal attention span and concentration. No anxious or depressed appearing.        Assessment & Plan:   Laceration, Seems to be healing well, stitches removed, wound looks good. Recommend to call if problems particularly if dehiscence   URI, see instructions

## 2013-10-21 NOTE — Progress Notes (Signed)
Pre-visit discussion using our clinic review tool. No additional management support is needed unless otherwise documented below in the visit note.  

## 2014-02-20 IMAGING — US US ART/VEN ABD/PELV/SCROTUM DOPPLER LTD
1 series · 14 of 25 positions shown · non-contrast
Comparison: None

CLINICAL DATA: Scrotal pain

SCROTAL ULTRASOUND
DOPPLER ULTRASOUND OF THE TESTICLES
TECHNIQUE: Complete ultrasound examination of the testicles,
epididymis, and other scrotal structures was performed.  Color and
spectral Doppler ultrasound were also utilized to evaluate blood
flow to the testicles.

[Series 1: us art/ven abd/pelv/scrotum doppler ltd · 0.07mm/px · 14 of 70 slices shown]
[im 1/70]
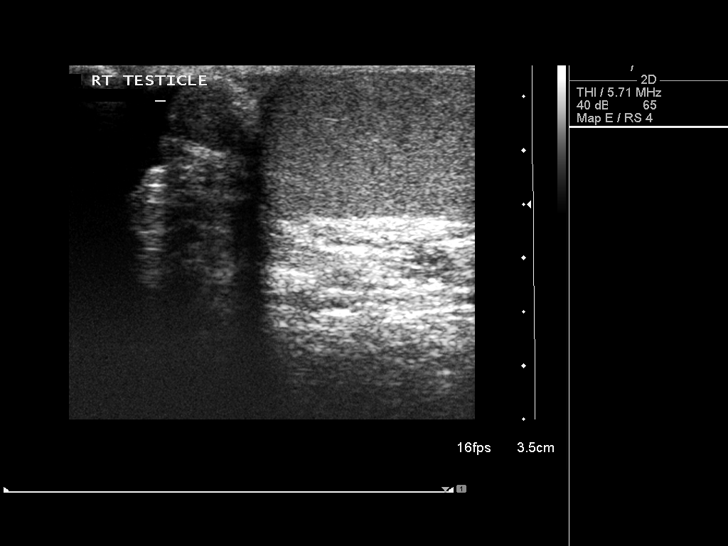
[im 6/70]
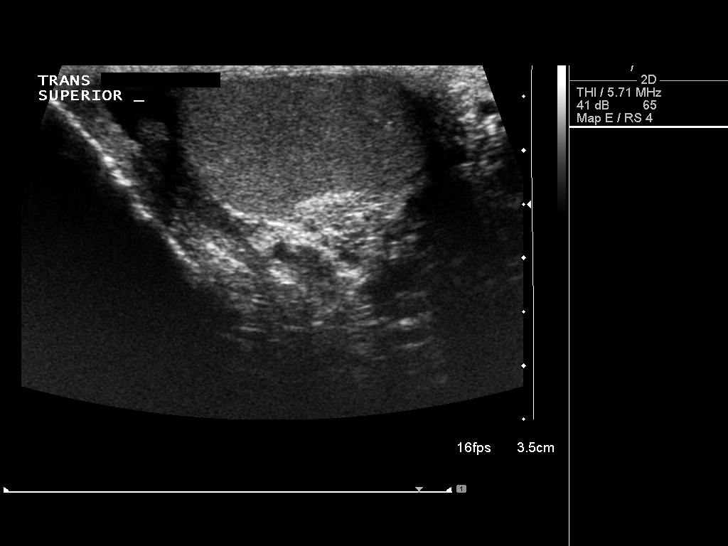
[im 12/70]
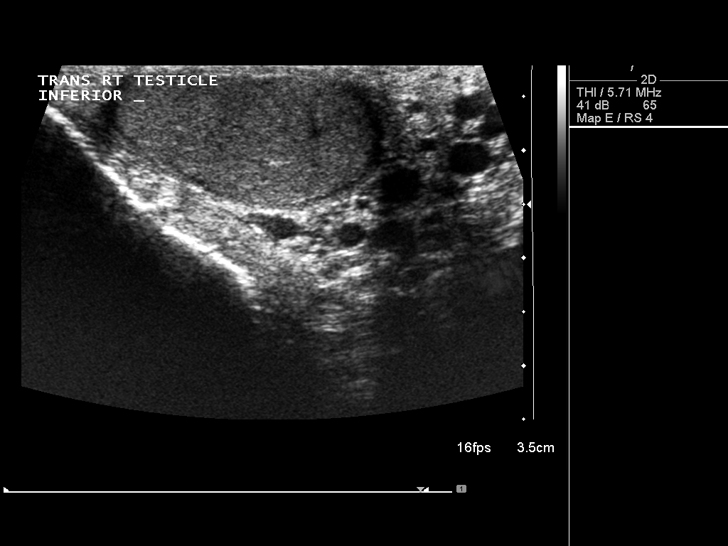
[im 18/70]
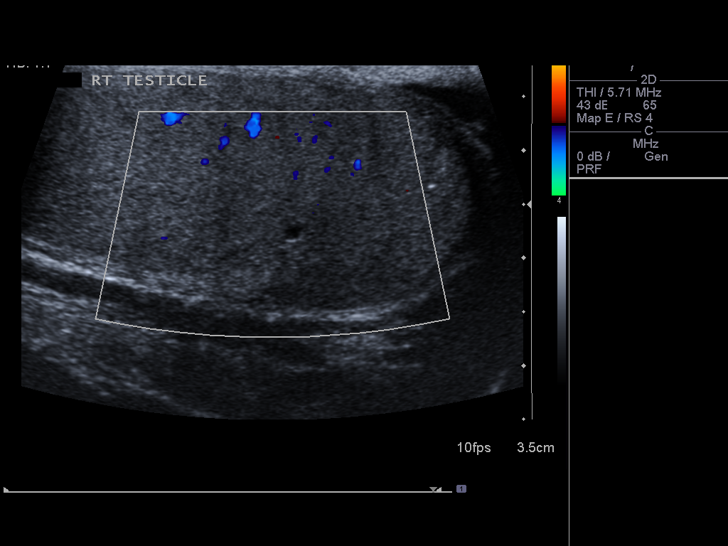
[im 24/70]
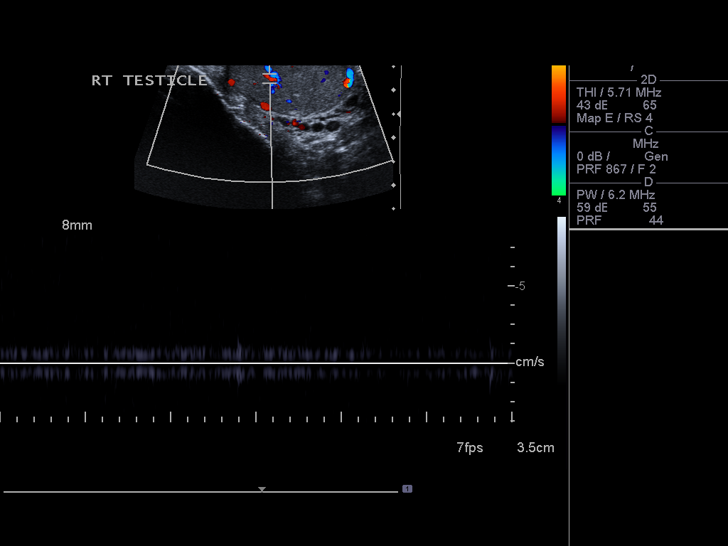
[im 26/70]
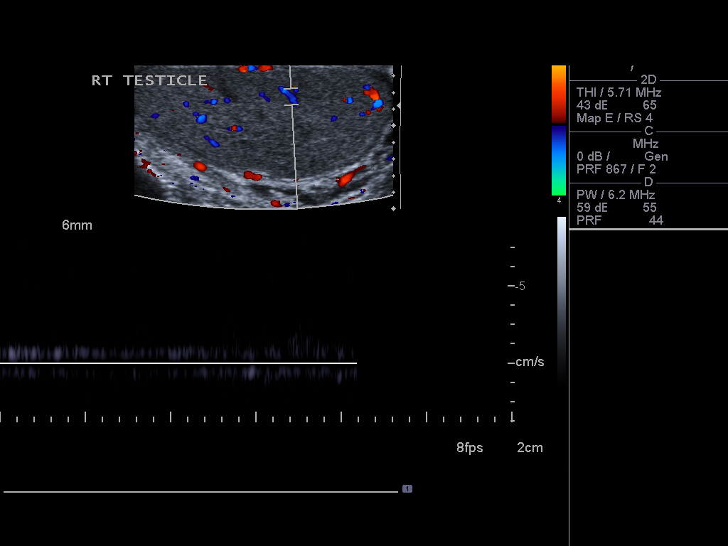
[im 32/70]
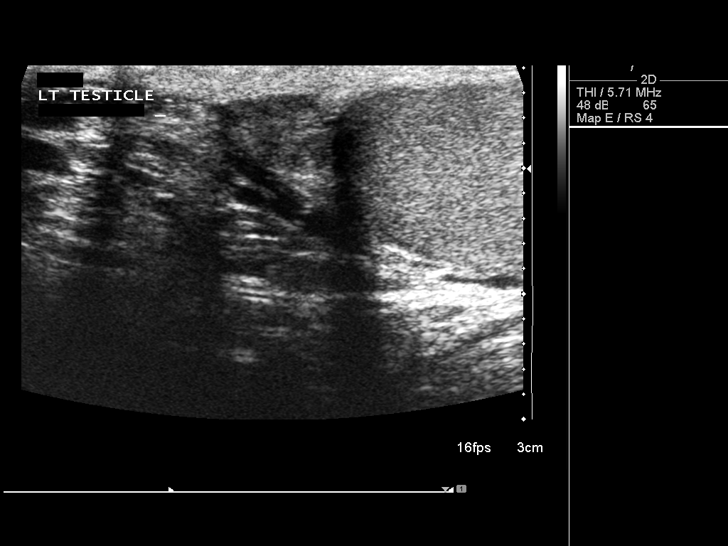
[im 38/70]
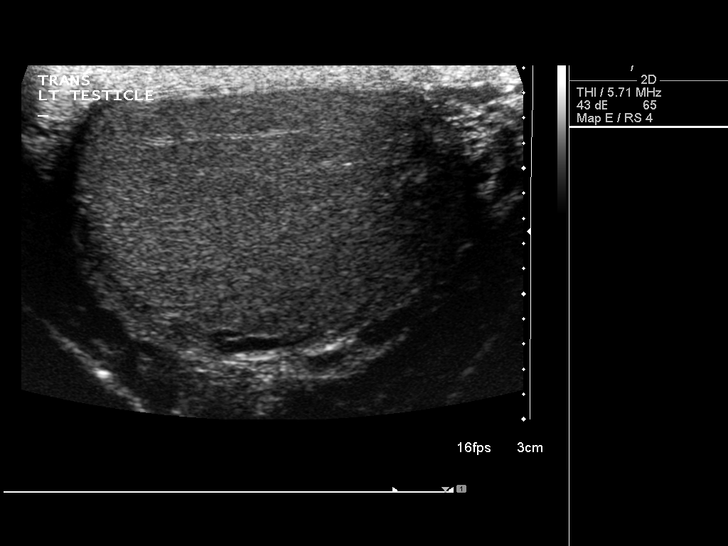
[im 44/70]
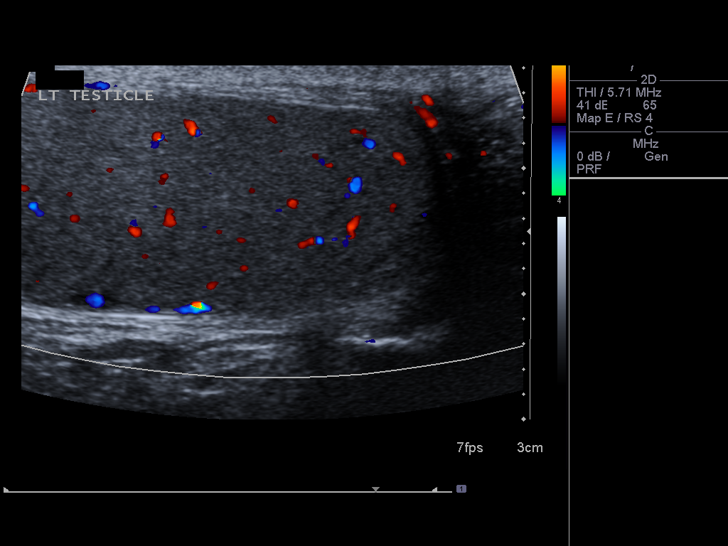
[im 47/70]
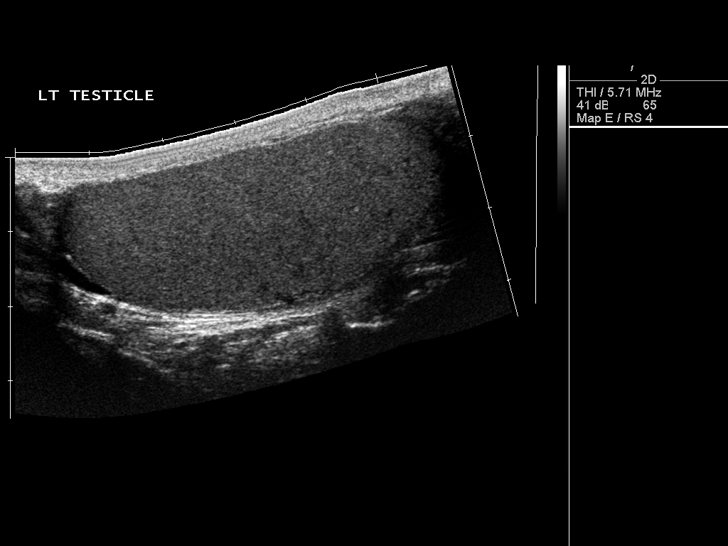
[im 52/70]
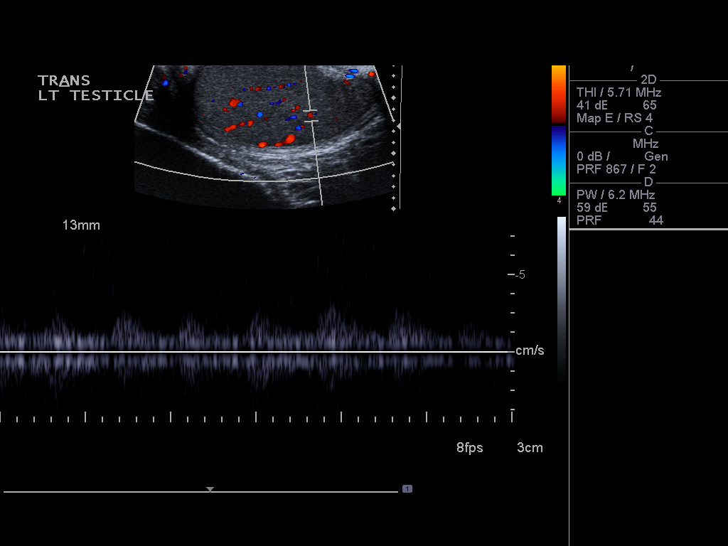
[im 58/70]
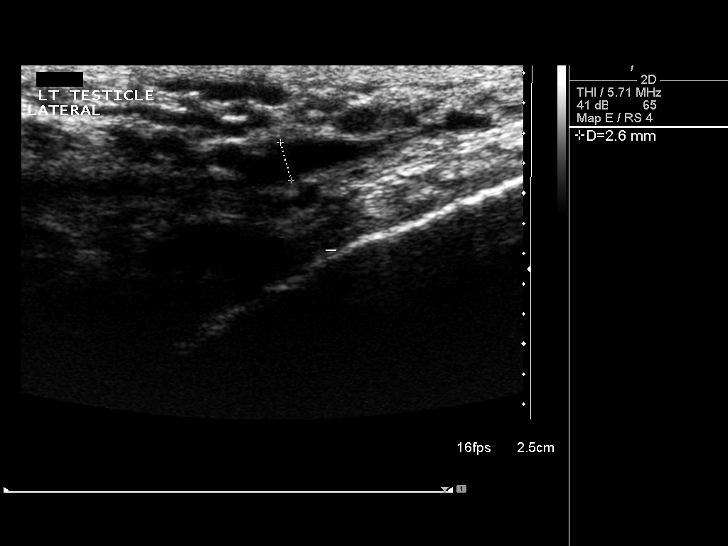
[im 64/70]
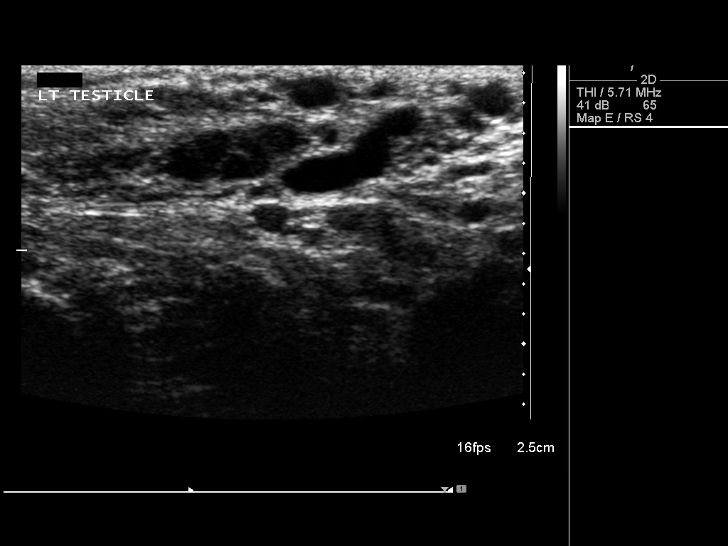
[im 70/70]
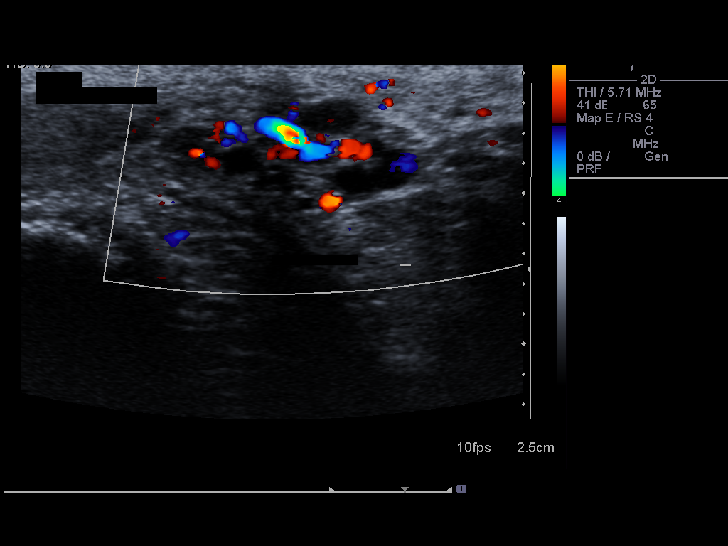

[14 of 25 positions shown; findings below may reference images not displayed]

FINDINGS: Both testicles are normal in size and echotexture with
the right testicle measuring 5.8 x 2.0 x 3.1 cm, and the left
testicle measuring 5.3 x 2.2 x 3.1 cm.  A 2 mm simple cyst is
present within the right testicle.

Both epididymi have a normal appearance.  There is no evidence of
hydrocele bilaterally.  There is a moderate-sized varicocele on the
left.

Pulsed Doppler interrogation of both testes demonstrates symmetric,
low resistance flow bilaterally.
IMPRESSION: Normal testicles and epididymi bilaterally.  No evidence of
hydrocele or torsion.

Moderate sized left varicocele.

## 2014-08-19 ENCOUNTER — Encounter: Payer: Self-pay | Admitting: Internal Medicine

## 2014-08-20 ENCOUNTER — Encounter: Payer: Self-pay | Admitting: *Deleted

## 2014-08-20 ENCOUNTER — Telehealth: Payer: Self-pay | Admitting: *Deleted

## 2014-08-20 NOTE — Telephone Encounter (Signed)
Pre-Visit Call completed with patient and chart updated.   Pre-Visit Info documented in Specialty Comments under [employer].    

## 2014-08-21 ENCOUNTER — Encounter: Payer: Self-pay | Admitting: Internal Medicine

## 2014-08-21 ENCOUNTER — Ambulatory Visit (INDEPENDENT_AMBULATORY_CARE_PROVIDER_SITE_OTHER): Payer: 59 | Admitting: Internal Medicine

## 2014-08-21 VITALS — BP 118/64 | HR 65 | Temp 98.0°F | Ht 69.0 in | Wt 163.0 lb

## 2014-08-21 DIAGNOSIS — Z0000 Encounter for general adult medical examination without abnormal findings: Secondary | ICD-10-CM | POA: Diagnosis not present

## 2014-08-21 DIAGNOSIS — K219 Gastro-esophageal reflux disease without esophagitis: Secondary | ICD-10-CM

## 2014-08-21 LAB — LIPID PANEL
CHOLESTEROL: 213 mg/dL — AB (ref 0–200)
HDL: 43.6 mg/dL (ref >39.00)
LDL Cholesterol: 142 mg/dL — ABNORMAL HIGH (ref 0–99)
NonHDL: 169.4
TRIGLYCERIDES: 139 mg/dL (ref 0.0–149.0)
Total CHOL/HDL Ratio: 5
VLDL: 27.8 mg/dL (ref 0.0–40.0)

## 2014-08-21 LAB — CBC WITH DIFFERENTIAL/PLATELET
BASOS PCT: 0.8 % (ref 0.0–3.0)
Basophils Absolute: 0 10*3/uL (ref 0.0–0.1)
EOS ABS: 0.1 10*3/uL (ref 0.0–0.7)
Eosinophils Relative: 3 % (ref 0.0–5.0)
HCT: 41.9 % (ref 39.0–52.0)
Hemoglobin: 14.3 g/dL (ref 13.0–17.0)
Lymphs Abs: 2.2 10*3/uL (ref 0.7–4.0)
MCHC: 34.1 g/dL (ref 30.0–36.0)
MCV: 81.9 fl (ref 78.0–100.0)
MONOS PCT: 6.9 % (ref 3.0–12.0)
Monocytes Absolute: 0.3 10*3/uL (ref 0.1–1.0)
NEUTROS ABS: 1.3 10*3/uL — AB (ref 1.4–7.7)
NEUTROS PCT: 33.2 % — AB (ref 43.0–77.0)
Platelets: 237 10*3/uL (ref 150.0–400.0)
RBC: 5.12 Mil/uL (ref 4.22–5.81)
RDW: 13.2 % (ref 11.5–15.5)
WBC: 3.9 10*3/uL — AB (ref 4.0–10.5)

## 2014-08-21 LAB — COMPREHENSIVE METABOLIC PANEL
ALBUMIN: 4.5 g/dL (ref 3.5–5.2)
ALT: 17 U/L (ref 0–53)
AST: 15 U/L (ref 0–37)
Alkaline Phosphatase: 35 U/L — ABNORMAL LOW (ref 39–117)
BUN: 14 mg/dL (ref 6–23)
CALCIUM: 9.8 mg/dL (ref 8.4–10.5)
CHLORIDE: 102 meq/L (ref 96–112)
CO2: 29 mEq/L (ref 19–32)
Creatinine, Ser: 1.08 mg/dL (ref 0.40–1.50)
GFR: 79.94 mL/min (ref >60.00)
Glucose, Bld: 85 mg/dL (ref 70–99)
POTASSIUM: 3.8 meq/L (ref 3.5–5.1)
SODIUM: 138 meq/L (ref 135–145)
Total Bilirubin: 0.6 mg/dL (ref 0.2–1.2)
Total Protein: 7.1 g/dL (ref 6.0–8.3)

## 2014-08-21 LAB — TSH: TSH: 1 u[IU]/mL (ref 0.35–4.50)

## 2014-08-21 MED ORDER — ESOMEPRAZOLE MAGNESIUM 40 MG PO CPDR: 40.0000 mg | DELAYED_RELEASE_CAPSULE | Freq: Every day | ORAL | Status: DC

## 2014-08-21 NOTE — Patient Instructions (Signed)
Get your blood work before you leave    Come back to the office in 1 year  for a physical exam  Please schedule an appointment at the front desk    Come back fasting     Testicular Self-Exam A self-examination of your testicles involves looking at and feeling your testicles for abnormal lumps or swelling. Several things can cause swelling, lumps, or pain in your testicles. Some of these causes are:  Injuries.  Inflammation.  Infection.  Accumulation of fluids around your testicle (hydrocele).  Twisted testicles (testicular torsion).  Testicular cancer. Self-examination of the testicles and groin areas may be advised if you are at risk for testicular cancer. Risks for testicular cancer include:  An undescended testicle (cryptorchidism).  A history of previous testicular cancer.  A family history of testicular cancer. The testicles are easiest to examine after warm baths or showers and are more difficult to examine when you are cold. This is because the muscles attached to the testicles retract and pull them up higher or into the abdomen. Follow these steps while you are standing:  Hold your penis away from your body.  Roll one testicle between your thumb and forefinger, feeling the entire testicle.  Roll the other testicle between your thumb and forefinger, feeling the entire testicle. Feel for lumps, swelling, or discomfort. A normal testicle is egg shaped and feels firm. It is smooth and not tender. The spermatic cord can be felt as a firm spaghetti-like cord at the back of your testicle. It is also important to examine the crease between the front of your leg and your abdomen. Feel for any bumps that are tender. These could be enlarged lymph nodes.  Document Released: 07/31/2000 Document Revised: 12/25/2012 Document Reviewed: 10/14/2012 [facility] Patient Information 2015 [place], [place]. This information is not intended to replace advice given to you by your health care  provider. Make sure you discuss any questions you have with your health care provider.

## 2014-08-21 NOTE — Progress Notes (Signed)
Subjective:    Patient ID: Scott Sexton, male    DOB: 1971/02/12, 44 y.o.   MRN: [id number]  DOS:  08/21/2014 Type of visit - description : cpx Interval history: In general doing very well   Review of Systems  Constitutional: No fever, chills. No unexplained wt changes. No unusual sweats HEENT: No dental problems, ear discharge, facial swelling, voice changes. No eye discharge, redness or intolerance to light Respiratory: No wheezing or difficulty breathing. No cough , mucus production Cardiovascular: No CP, leg swelling or palpitations GI: no nausea, vomiting, diarrhea or abdominal pain.  No blood in the stools. No dysphagia . Needs PPIs as needed  Endocrine: No polyphagia, polyuria or polydipsia GU: No dysuria, gross hematuria, difficulty urinating. No urinary urgency or frequency. Musculoskeletal:  Saw orthopedic surgery yesterday regards a left ankle injury, was prescribed PT Also, on and off left knee pain for more than a year, no swelling or redness, no injury, started to take glucosamine and symptoms are gradually decreasing Skin: No change in the color of the skin, palor or rash Allergic, immunologic: No environmental allergies or food allergies Neurological: No dizziness or syncope. No headaches. No diplopia, slurred speech, motor deficits, facial numbness Hematological: No enlarged lymph nodes, easy bruising or bleeding Psychiatry: No suicidal ideas, hallucinations, behavior problems or confusion. No unusual/severe anxiety or depression.    Past Medical History  Diagnosis Date  . GERD (gastroesophageal reflux disease)   . Gastritis     EGD 08/2007, H.Pylori positive, status post treatment    Past Surgical History  Procedure Laterality Date  . Tonsillectomy      History   Social History  . Marital Status: Married    Spouse Name: N/A  . Number of Children: 1  . Years of Education: N/A   Occupational History  . Art gallery manager @ [employer]     Social  History Main Topics  . Smoking status: Never Smoker   . Smokeless tobacco: Never Used  . Alcohol Use: Yes     Comment: socially  . Drug Use: No  . Sexual Activity: Not on file   Other Topics Concern  . Not on file   Social History Narrative   Originally from [place],    Married, 1 son, boy, 2005         Family History  Problem Relation Age of Onset  . Diabetes Mother   . Hypertension Mother   . Coronary artery disease Mother     onset in her 66s, had an angioplasty in 57  . Coronary artery disease Other     uncle  . Stroke Mother     ?  . Colon cancer Neg Hx   . Prostate cancer Neg Hx        Medication List       This list is accurate as of: 08/21/14 11:59 PM.  Always use your most recent med list.               esomeprazole 40 MG capsule  Commonly known as:  NEXIUM  Take 1 capsule (40 mg total) by mouth daily before breakfast.     OVER THE COUNTER MEDICATION  Take 1 tablet by mouth daily. Multivitamin for joint health           Objective:   Physical Exam BP 118/64 mmHg  Pulse 65  Temp(Src) 98 F (36.7 C) (Oral)  Ht 5\' 9"  (1.753 m)  Wt 163 lb (73.936 kg)  BMI 24.06 kg/m2  SpO2 97%  General:   Well developed, well nourished . NAD.  Neck:  Full range of motion. Supple. No  Thyromegaly  HEENT:  Normocephalic . Face symmetric, atraumatic Lungs:  CTA B Normal respiratory effort, no intercostal retractions, no accessory muscle use. Heart: RRR,  no murmur.  Abdomen:  Not distended, soft, non-tender. No rebound or rigidity. No mass,organomegaly Muscle skeletal: no pretibial edema bilaterally  Knees normal to inspection, palpation, joints are stable Skin: Exposed areas without rash. Not pale. Not jaundice Neurologic:  alert & oriented X3.  Speech normal, gait appropriate for age and unassisted Strength symmetric and appropriate for age.  Psych: Cognition and judgment appear intact.  Cooperative with normal attention span and concentration.    Behavior appropriate. No anxious or depressed appearing.       Assessment & Plan:

## 2014-08-21 NOTE — Assessment & Plan Note (Addendum)
Td  07-2010 at the ER [facility] Continue with a healthy diet, increase exercise  Other issues GERD, symptoms well controlled with PPIs as needed Knee pain, not a problem at this time, recommend stretching and try to exercise in a way that he doesn't hurt his knee (elliptical, walking)  RTC 1 year

## 2014-08-21 NOTE — Progress Notes (Signed)
Pre visit review using our clinic review tool, if applicable. No additional management support is needed unless otherwise documented below in the visit note. 

## 2014-08-22 LAB — HIV ANTIBODY (ROUTINE TESTING W REFLEX): HIV 1&2 Ab, 4th Generation: NONREACTIVE

## 2015-10-25 ENCOUNTER — Telehealth: Payer: Self-pay | Admitting: Internal Medicine

## 2015-10-25 DIAGNOSIS — K219 Gastro-esophageal reflux disease without esophagitis: Secondary | ICD-10-CM

## 2015-10-25 MED ORDER — ESOMEPRAZOLE MAGNESIUM 40 MG PO CPDR: 40.0000 mg | DELAYED_RELEASE_CAPSULE | Freq: Every day | ORAL | Status: DC

## 2015-10-25 NOTE — Telephone Encounter (Signed)
°  Relationship to patient: Self Can be reached: Pharmacy:  [id number] - [place], [place] [phone] (Phone) [phone] (Fax)         Reason: Refill on esomeprazole (NEXIUM) 40 MG capsule

## 2015-10-25 NOTE — Telephone Encounter (Signed)
90 day supply sent. Please call Pt, he is overdue for CPE, last OV 08/2014. Please schedule at Pt's convenience. Okay to put two 15 minute appts together if needed. Thank you.

## 2015-10-26 NOTE — Telephone Encounter (Signed)
Noted  

## 2015-10-26 NOTE — Telephone Encounter (Signed)
Called patient and scheduled CPE for 8/31

## 2015-11-03 ENCOUNTER — Telehealth: Payer: Self-pay | Admitting: *Deleted

## 2015-11-03 NOTE — Telephone Encounter (Signed)
Attempted to complete PA for medication on [email or web address] ([employer]). Received message that policy is inactive. The pharmacy has the same info as [place]. Called pt, mailbox full/unable to leave message. JG/CMA

## 2015-11-04 ENCOUNTER — Telehealth: Payer: Self-pay

## 2015-11-04 MED ORDER — PANTOPRAZOLE SODIUM 40 MG PO TBEC: 40.0000 mg | DELAYED_RELEASE_TABLET | Freq: Every day | ORAL | Status: DC

## 2015-11-04 NOTE — Telephone Encounter (Signed)
Nexium 40 mg d/c, Pantoprazole 40 mg 1 tab daily, #30 and 3 RF sent to [facility].

## 2015-11-04 NOTE — Telephone Encounter (Signed)
Received fax from [facility] regarding Pt's Nexium 40 mg capsules. Insurance will not cover. Preferred: Omeprazole, Pantoprazole, Rabeprazole, Dexilant. Please advise.

## 2015-11-04 NOTE — Telephone Encounter (Signed)
Okay pantoprazole 40 mg 1 by mouth every day, #30 and 3 refills They need to let the patient know about the change

## 2015-11-08 NOTE — Telephone Encounter (Signed)
As per patient Nexium is in need of a PA patient will contact pharmacy and have PA form faxed to [phone] Attention Lamount Cohen. Patient states pharmacy faxed over PA form numerous times. Please advise

## 2015-11-08 NOTE — Telephone Encounter (Signed)
As noted below by Dr. Larose Kells, the Nexium has been discontinued and changed to pantoprazole since insurance does not cover.  I called pt at [phone] and received a message stating the phone number has been disconnected.

## 2016-01-06 ENCOUNTER — Ambulatory Visit (INDEPENDENT_AMBULATORY_CARE_PROVIDER_SITE_OTHER): Payer: 59 | Admitting: Internal Medicine

## 2016-01-06 ENCOUNTER — Encounter: Payer: Self-pay | Admitting: Internal Medicine

## 2016-01-06 VITALS — BP 118/74 | HR 72 | Temp 98.1°F | Resp 14 | Ht 69.0 in | Wt 164.1 lb

## 2016-01-06 DIAGNOSIS — Z0000 Encounter for general adult medical examination without abnormal findings: Secondary | ICD-10-CM

## 2016-01-06 DIAGNOSIS — Z09 Encounter for follow-up examination after completed treatment for conditions other than malignant neoplasm: Secondary | ICD-10-CM | POA: Insufficient documentation

## 2016-01-06 LAB — LIPID PANEL
Cholesterol: 214 mg/dL — ABNORMAL HIGH (ref 0–200)
HDL: 38.6 mg/dL — AB (ref >39.00)
LDL CALC: 136 mg/dL — AB (ref 0–99)
NONHDL: 175.39
Total CHOL/HDL Ratio: 6
Triglycerides: 195 mg/dL — ABNORMAL HIGH (ref 0.0–149.0)
VLDL: 39 mg/dL (ref 0.0–40.0)

## 2016-01-06 LAB — BASIC METABOLIC PANEL
BUN: 17 mg/dL (ref 6–23)
CHLORIDE: 101 meq/L (ref 96–112)
CO2: 31 mEq/L (ref 19–32)
Calcium: 9.4 mg/dL (ref 8.4–10.5)
Creatinine, Ser: 1.17 mg/dL (ref 0.40–1.50)
GFR: 72.41 mL/min (ref >60.00)
GLUCOSE: 106 mg/dL — AB (ref 70–99)
POTASSIUM: 3.8 meq/L (ref 3.5–5.1)
SODIUM: 138 meq/L (ref 135–145)

## 2016-01-06 MED ORDER — SILDENAFIL CITRATE 20 MG PO TABS: 60.0000 mg | ORAL_TABLET | Freq: Every evening | ORAL | 3 refills | Status: DC | PRN

## 2016-01-06 MED ORDER — PANTOPRAZOLE SODIUM 40 MG PO TBEC: 40.0000 mg | DELAYED_RELEASE_TABLET | Freq: Every day | ORAL | 11 refills | Status: DC

## 2016-01-06 NOTE — Progress Notes (Signed)
Pre visit review using our clinic review tool, if applicable. No additional management support is needed unless otherwise documented below in the visit note. 

## 2016-01-06 NOTE — Assessment & Plan Note (Signed)
GERD: Has a sporadic sx, printed rx for pantoprazole as his insurance did not cover Nexium. Mild ED: Wonders about Viagra, okay to use if needed, prescription and instructions provided. RTC one year

## 2016-01-06 NOTE — Assessment & Plan Note (Addendum)
Td  07-2010 at the ER Labs reviewed, due for a FLP and CMP rec a healthy diet, increase exercise

## 2016-01-06 NOTE — Progress Notes (Signed)
Subjective:    Patient ID: Scott Sexton, male    DOB: 08/02/1970, 45 y.o.   MRN: [id number]  DOS:  01/06/2016 Type of visit - description : cpx Interval history: No major concerns.    Review of Systems Constitutional: No fever. No chills. No unexplained wt changes. No unusual sweats  HEENT: No dental problems, no ear discharge, no facial swelling, no voice changes. No eye discharge, no eye  redness , no  intolerance to light   Respiratory: No wheezing , no  difficulty breathing. No cough , no mucus production  Cardiovascular: No CP, no leg swelling , no  Palpitations  GI: no nausea, no vomiting, no diarrhea , no  abdominal pain.  No blood in the stools. No dysphagia, no odynophagia    Endocrine: No polyphagia, no polyuria , no polydipsia. Occasionally has decreased libido , mild ED  GU: No dysuria, gross hematuria, difficulty urinating. No urinary urgency, no frequency.  Musculoskeletal: No joint swellings or unusual aches or pains  Skin: No change in the color of the skin, palor , no  Rash  Allergic, immunologic: No environmental allergies , no  food allergies  Neurological: No dizziness no  syncope. No headaches. No diplopia, no slurred, no slurred speech, no motor deficits, no facial  Numbness  Hematological: No enlarged lymph nodes, no easy bruising , no unusual bleedings  Psychiatry: No suicidal ideas, no hallucinations, no beavior problems, no confusion.  No unusual/severe anxiety, no depression   Past Medical History:  Diagnosis Date  . Gastritis    EGD 08/2007, H.Pylori positive, status post treatment  . GERD (gastroesophageal reflux disease)     Past Surgical History:  Procedure Laterality Date  . TONSILLECTOMY      Social History   Social History  . Marital status: Married    Spouse name: N/A  . Number of children: 1  . Years of education: N/A   Occupational History  . 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 Smoking  status: Never Smoker  . Smokeless tobacco: Never Used  . Alcohol use Yes     Comment: socially  . Drug use: No  . Sexual activity: Not on file   Other Topics Concern  . Not on file   Social History Narrative   Originally from [place],    Married, 1 son, boy, 2005         Family History  Problem Relation Age of Onset  . Diabetes Mother   . Hypertension Mother   . Coronary artery disease Mother     onset in her 26s, had an angioplasty in 4  . Stroke Mother     ?  . Coronary artery disease Other     uncle  . Colon cancer Neg Hx   . Prostate cancer Neg Hx        Medication List       Accurate as of 01/06/16  2:21 PM. Always use your most recent med list.          OVER THE COUNTER MEDICATION Take 1 tablet by mouth daily. Multivitamin for joint health   pantoprazole 40 MG tablet Commonly known as:  PROTONIX Take 1 tablet (40 mg total) by mouth daily before breakfast.   sildenafil 20 MG tablet Commonly known as:  REVATIO Take 3-4 tablets (60-80 mg total) by mouth at bedtime as needed.          Objective:   Physical Exam  BP 118/74 (BP Location: Left Arm, Patient Position: Sitting, Cuff Size: Normal)   Pulse 72   Temp 98.1 F (36.7 C) (Oral)   Resp 14   Ht 5\' 9"  (1.753 m)   Wt 164 lb 2 oz (74.4 kg)   SpO2 97%   BMI 24.24 kg/m   General:   Well developed, well nourished . NAD.  Neck: No  thyromegaly  HEENT:  Normocephalic . Face symmetric, atraumatic Lungs:  CTA B Normal respiratory effort, no intercostal retractions, no accessory muscle use. Heart: RRR,  no murmur.  No pretibial edema bilaterally  Abdomen:  Not distended, soft, non-tender. No rebound or rigidity.   Skin: Exposed areas without rash. Not pale. Not jaundice Neurologic:  alert & oriented X3.  Speech normal, gait appropriate for age and unassisted Strength symmetric and appropriate for age.  Psych: Cognition and judgment appear intact.  Cooperative with normal attention span and  concentration.  Behavior appropriate. No anxious or depressed appearing.    Assessment & Plan:   Assessment H. pylori + gastritis per EGD 2009, S/P Rx GERD Varicocele. Re-eaval by urology 2016, [place] confirmed dx, L>R  PLAN: GERD: Has a sporadic sx, printed rx for pantoprazole as his insurance did not cover Nexium. Mild ED: Wonders about Viagra, okay to use if needed, prescription and instructions provided. RTC one year

## 2016-01-06 NOTE — Patient Instructions (Signed)
GO TO THE LAB : Get the blood work     GO TO THE FRONT DESK Schedule your next appointment for a  Physical exam in 1 year 

## 2016-02-23 ENCOUNTER — Telehealth: Payer: Self-pay | Admitting: Internal Medicine

## 2016-02-23 MED ORDER — SILDENAFIL CITRATE 20 MG PO TABS: 60.0000 mg | ORAL_TABLET | Freq: Every evening | ORAL | 3 refills | Status: DC | PRN

## 2016-02-23 NOTE — Telephone Encounter (Signed)
Rx resent.

## 2016-02-23 NOTE — Telephone Encounter (Signed)
Relation to [id number] Call back [phone] Pharmacy: [facility] [place], [place] [phone] (Phone) [phone] (Fax)    Reason for call:  Patient checking on the status of sildenafil (REVATIO) 20 MG tablet patient states pharmacy never received. Please advise

## 2016-03-08 NOTE — Telephone Encounter (Signed)
Patient is calling stating that there was a PA that was supposed done on this medication. He would like to check the status of this. He is a little frustrated because he has been trying to get this medication for a month now. He would like to know if you could get it done today. Please advise.

## 2016-03-08 NOTE — Telephone Encounter (Signed)
Have not received PA request from pharmacy. Will need request to initiate. Please have Pt contact his pharmacy and have them fax over PA request.

## 2016-03-09 NOTE — Telephone Encounter (Signed)
Called and spoke with patient today. I informed him that we had not received the PA yet. I told him I would call the pharmacy to make sure they fax it. Patient is just confused to why he was told it was sent when it wasn't. I told patient to check back with [place] or his pharmacy on Monday. I called pharmacy and they stated they will send the PA over to [place].

## 2016-03-09 NOTE — Telephone Encounter (Signed)
Called patient but there was no VM

## 2016-03-10 NOTE — Telephone Encounter (Signed)
Received PA denial notification. Pt informed of insurance decision. Pt verbalized understanding. Notification sent for scanning.

## 2016-03-10 NOTE — Telephone Encounter (Signed)
PA initiated via Covermymeds;KEY: [id number]. Awaiting determination.

## 2016-07-24 DIAGNOSIS — M5386 Other specified dorsopathies, lumbar region: Secondary | ICD-10-CM | POA: Diagnosis not present

## 2016-07-24 DIAGNOSIS — M9903 Segmental and somatic dysfunction of lumbar region: Secondary | ICD-10-CM | POA: Diagnosis not present

## 2016-07-24 DIAGNOSIS — M5431 Sciatica, right side: Secondary | ICD-10-CM | POA: Diagnosis not present

## 2016-08-16 ENCOUNTER — Encounter: Payer: Self-pay | Admitting: Internal Medicine

## 2016-08-16 ENCOUNTER — Ambulatory Visit (INDEPENDENT_AMBULATORY_CARE_PROVIDER_SITE_OTHER): Payer: 59 | Admitting: Internal Medicine

## 2016-08-16 VITALS — BP 118/74 | HR 77 | Temp 98.0°F | Resp 14 | Ht 69.0 in | Wt 163.5 lb

## 2016-08-16 DIAGNOSIS — R9431 Abnormal electrocardiogram [ECG] [EKG]: Secondary | ICD-10-CM

## 2016-08-16 DIAGNOSIS — Z8249 Family history of ischemic heart disease and other diseases of the circulatory system: Secondary | ICD-10-CM | POA: Diagnosis not present

## 2016-08-16 NOTE — Patient Instructions (Signed)
We will schedule a treadmill stress test.

## 2016-08-16 NOTE — Progress Notes (Signed)
   Subjective:    Patient ID: Scott Sexton, male    DOB: [birth year], 46 y.o.   MRN: [id number]  DOS:  08/16/2016 Type of visit - description : Acute, stress test? Interval history: The patient is concerned about his heart, his mother was dx w/ heart disease at age 40, last week she has some heart issues and he likes to be tested.   Review of Systems The patient feels well, is not physically active, denies chest pain, difficulty breathing or lower extremity edema  Past Medical History:  Diagnosis Date  . Gastritis    EGD 08/2007, H.Pylori positive, status post treatment  . GERD (gastroesophageal reflux disease)     Past Surgical History:  Procedure Laterality Date  . TONSILLECTOMY      Social History   Social History  . Marital status: Married    Spouse name: N/A  . Number of children: 1  . Years of education: N/A   Occupational History  . 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 Smoking status: Never Smoker  . Smokeless tobacco: Never Used  . Alcohol use Yes     Comment: socially  . Drug use: No  . Sexual activity: Not on file   Other Topics Concern  . Not on file   Social History Narrative   Originally from [place],    Married, 1 son, boy, 2005          Allergies as of 08/16/2016   No Known Allergies     Medication List       Accurate as of 08/16/16 11:59 PM. Always use your most recent med list.          multivitamin tablet Take 1 tablet by mouth daily.   pantoprazole 40 MG tablet Commonly known as:  PROTONIX Take 1 tablet (40 mg total) by mouth daily before breakfast.   sildenafil 20 MG tablet Commonly known as:  REVATIO Take 3-4 tablets (60-80 mg total) by mouth at bedtime as needed.          Objective:   Physical Exam BP 118/74 (BP Location: Left Arm, Patient Position: Sitting, Cuff Size: Small)   Pulse 77   Temp 98 F (36.7 C) (Oral)   Resp 14   Ht 5\' 9"  (1.753 m)   Wt 163 lb 8 oz (74.2 kg)   SpO2 98%    BMI 24.14 kg/m  General:   Well developed, well nourished . NAD.  HEENT:  Normocephalic . Face symmetric, atraumatic Lungs:  CTA B Normal respiratory effort, no intercostal retractions, no accessory muscle use. Heart: RRR,  no murmur.  No pretibial edema bilaterally  Skin: Not pale. Not jaundice Neurologic:  alert & oriented X3.  Speech normal, gait appropriate for age and unassisted Psych--  Cognition and judgment appear intact.  Cooperative with normal attention span and concentration.  Behavior appropriate. No anxious or depressed appearing.      Assessment & Plan:   Assessment H. pylori + gastritis per EGD 2009, S/P Rx GERD Varicocele. Re-eaval by urology 2016, [place] confirmed dx, L>R  PLAN: + FH CAD: Patient concerned about his family history, he does not smoke, last LDL 136, he has no symptoms. EKG today - normal sinus with I RBBB.  Recommend treadmill stress test.

## 2016-08-16 NOTE — Progress Notes (Signed)
Pre visit review using our clinic review tool, if applicable. No additional management support is needed unless otherwise documented below in the visit note. 

## 2016-08-18 NOTE — Assessment & Plan Note (Signed)
+   FH CAD: Patient concerned about his family history, he does not smoke, last LDL 136, he has no symptoms. EKG today - normal sinus with I RBBB.  Recommend treadmill stress test.

## 2016-11-09 DIAGNOSIS — M5386 Other specified dorsopathies, lumbar region: Secondary | ICD-10-CM | POA: Diagnosis not present

## 2016-11-09 DIAGNOSIS — M9903 Segmental and somatic dysfunction of lumbar region: Secondary | ICD-10-CM | POA: Diagnosis not present

## 2016-11-09 DIAGNOSIS — M5431 Sciatica, right side: Secondary | ICD-10-CM | POA: Diagnosis not present

## 2016-11-24 DIAGNOSIS — M9905 Segmental and somatic dysfunction of pelvic region: Secondary | ICD-10-CM | POA: Diagnosis not present

## 2016-11-24 DIAGNOSIS — M9903 Segmental and somatic dysfunction of lumbar region: Secondary | ICD-10-CM | POA: Diagnosis not present

## 2016-11-24 DIAGNOSIS — M9902 Segmental and somatic dysfunction of thoracic region: Secondary | ICD-10-CM | POA: Diagnosis not present

## 2017-04-12 ENCOUNTER — Other Ambulatory Visit: Payer: Self-pay | Admitting: Internal Medicine

## 2017-06-27 DIAGNOSIS — M9902 Segmental and somatic dysfunction of thoracic region: Secondary | ICD-10-CM | POA: Diagnosis not present

## 2017-06-27 DIAGNOSIS — M9903 Segmental and somatic dysfunction of lumbar region: Secondary | ICD-10-CM | POA: Diagnosis not present

## 2017-06-27 DIAGNOSIS — M9905 Segmental and somatic dysfunction of pelvic region: Secondary | ICD-10-CM | POA: Diagnosis not present

## 2017-07-05 DIAGNOSIS — L309 Dermatitis, unspecified: Secondary | ICD-10-CM | POA: Diagnosis not present

## 2017-07-05 DIAGNOSIS — L219 Seborrheic dermatitis, unspecified: Secondary | ICD-10-CM | POA: Diagnosis not present

## 2017-08-27 ENCOUNTER — Encounter: Payer: Self-pay | Admitting: Family Medicine

## 2017-08-27 ENCOUNTER — Ambulatory Visit (INDEPENDENT_AMBULATORY_CARE_PROVIDER_SITE_OTHER): Payer: 59 | Admitting: Family Medicine

## 2017-08-27 VITALS — BP 126/60 | HR 69 | Temp 97.9°F | Resp 16 | Ht 69.0 in | Wt 170.0 lb

## 2017-08-27 DIAGNOSIS — M5440 Lumbago with sciatica, unspecified side: Secondary | ICD-10-CM

## 2017-08-27 DIAGNOSIS — M545 Low back pain, unspecified: Secondary | ICD-10-CM

## 2017-08-27 MED ORDER — CYCLOBENZAPRINE HCL 10 MG PO TABS: 10.0000 mg | ORAL_TABLET | Freq: Three times a day (TID) | ORAL | 0 refills | Status: DC | PRN

## 2017-08-27 MED ORDER — DICLOFENAC SODIUM 75 MG PO TBEC: 75.0000 mg | DELAYED_RELEASE_TABLET | Freq: Two times a day (BID) | ORAL | 0 refills | Status: DC

## 2017-08-27 NOTE — Progress Notes (Signed)
Patient ID: Scott Sexton, male   DOB: [birth year], 47 y.o.   MRN: [id number]    Subjective:  I acted as a Education administrator for Dr. Carollee Herter.  Guerry Bruin, [place]   Patient ID: Scott Sexton, male    DOB: 1970/06/16, 47 y.o.   MRN: [id number]  Chief Complaint  Patient presents with  . Back Pain    Back Pain  This is a new problem. The current episode started yesterday. The problem occurs constantly. The pain is present in the lumbar spine. The quality of the pain is described as aching and cramping. Radiates to: radiate down right leg. The symptoms are aggravated by bending, position, twisting, standing and sitting. Associated symptoms include leg pain (back of right leg). Pertinent negatives include no abdominal pain, bladder incontinence, bowel incontinence, chest pain, dysuria, fever, headaches, numbness, paresis, paresthesias, tingling or weakness. Risk factors: has history of low back pain and sciatica. He has tried NSAIDs (Advil and Biofreeze) for the symptoms.    Patient is in today for back pain.  Patient Care Team: Colon Branch, MD as PCP - General   Past Medical History:  Diagnosis Date  . Gastritis    EGD 08/2007, H.Pylori positive, status post treatment  . GERD (gastroesophageal reflux disease)     Past Surgical History:  Procedure Laterality Date  . TONSILLECTOMY      Family History  Problem Relation Age of Onset  . Diabetes Mother   . Hypertension Mother   . Coronary artery disease Mother        onset in her 45s, had an angioplasty in 34  . Stroke Mother        ?  . Coronary artery disease Other        uncle  . Colon cancer Neg Hx   . Prostate cancer Neg Hx     Social History   Socioeconomic History  . Marital status: Married    Spouse name: Not on file  . Number of children: 1  . Years of education: Not on file  . Highest education level: Not on file  Occupational History  . Occupation: Art gallery manager @ [employer]  . Financial resource strain:  Not on file  . Food insecurity:    Worry: Not on file    Inability: Not on file  . Transportation needs:    Medical: Not on file    Non-medical: Not on file  Tobacco Use  . Smoking status: Never Smoker  . Smokeless tobacco: Never Used  Substance and Sexual Activity  . Alcohol use: Yes    Comment: socially  . Drug use: No  . Sexual activity: Not on file  Lifestyle  . Physical activity:    Days per week: Not on file    Minutes per session: Not on file  . Stress: Not on file  Relationships  . Social connections:    Talks on phone: Not on file    Gets together: Not on file    Attends religious service: Not on file    Active member of club or organization: Not on file    Attends meetings of clubs or organizations: Not on file    Relationship status: Not on file  . Intimate partner violence:    Fear of current or ex partner: Not on file    Emotionally abused: Not on file    Physically abused: Not on file    Forced sexual activity: Not on file  Other Topics  Concern  . Not on file  Social History Narrative   Originally from [place],    Married, 1 son, boy, 2005        Outpatient Medications Prior to Visit  Medication Sig Dispense Refill  . Multiple Vitamin (MULTIVITAMIN) tablet Take 1 tablet by mouth daily.    . sildenafil (REVATIO) 20 MG tablet Take 3-4 tablets by mouth at bedtime as needed 30 tablet 3  . pantoprazole (PROTONIX) 40 MG tablet Take 1 tablet (40 mg total) by mouth daily before breakfast. (Patient not taking: Reported on 08/16/2016) 30 tablet 11   No facility-administered medications prior to visit.     No Known Allergies  Review of Systems  Constitutional: Negative for fever.  Cardiovascular: Negative for chest pain.  Gastrointestinal: Negative for abdominal pain and bowel incontinence.  Genitourinary: Negative for bladder incontinence and dysuria.  Musculoskeletal: Positive for back pain.  Neurological: Negative for tingling, weakness, numbness,  headaches and paresthesias.       Objective:    Physical Exam  Constitutional: He is oriented to person, place, and time. Vital signs are normal. He appears well-developed and well-nourished. He is sleeping.  HENT:  Head: Normocephalic and atraumatic.  Mouth/Throat: Oropharynx is clear and moist.  Eyes: Pupils are equal, round, and reactive to light. EOM are normal.  Neck: Normal range of motion. Neck supple. No thyromegaly present.  Cardiovascular: Normal rate and regular rhythm.  No murmur heard. Pulmonary/Chest: Effort normal and breath sounds normal. No respiratory distress. He has no wheezes. He has no rales. He exhibits no tenderness.  Musculoskeletal: Normal range of motion. He exhibits tenderness. He exhibits no edema.       Right shoulder: He exhibits tenderness, pain and spasm. He exhibits normal range of motion, no bony tenderness, no swelling, no effusion, no crepitus, no deformity and no laceration.       Arms: dtr = and b/l -- low ext Good strength in low ext Neg slr   Neurological: He is alert and oriented to person, place, and time.  Skin: Skin is warm and dry.  Psychiatric: He has a normal mood and affect. His behavior is normal. Judgment and thought content normal.  Nursing note and vitals reviewed.   BP 126/60 (BP Location: Right Arm, Cuff Size: Normal)   Pulse 69   Temp 97.9 F (36.6 C) (Oral)   Resp 16   Ht 5\' 9"  (1.753 m)   Wt 170 lb (77.1 kg)   SpO2 98%   BMI 25.10 kg/m  Wt Readings from Last 3 Encounters:  08/27/17 170 lb (77.1 kg)  08/16/16 163 lb 8 oz (74.2 kg)  01/06/16 164 lb 2 oz (74.4 kg)   BP Readings from Last 3 Encounters:  08/27/17 126/60  08/16/16 118/74  01/06/16 118/74     Immunization History  Administered Date(s) Administered  . Influenza Whole 03/01/2009  . Tdap 07/09/2010    Health Maintenance  Topic Date Due  . INFLUENZA VACCINE  12/06/2017  . TETANUS/TDAP  07/08/2020  . HIV Screening  Completed    Lab Results    Component Value Date   WBC 3.9 (L) 08/21/2014   HGB 14.3 08/21/2014   HCT 41.9 08/21/2014   PLT 237.0 08/21/2014   GLUCOSE 106 (H) 01/06/2016   CHOL 214 (H) 01/06/2016   TRIG 195.0 (H) 01/06/2016   HDL 38.60 (L) 01/06/2016   LDLDIRECT 151.6 09/07/2011   LDLCALC 136 (H) 01/06/2016   ALT 17 08/21/2014   AST 15 08/21/2014  NA 138 01/06/2016   K 3.8 01/06/2016   CL 101 01/06/2016   CREATININE 1.17 01/06/2016   BUN 17 01/06/2016   CO2 31 01/06/2016   TSH 1.00 08/21/2014    Lab Results  Component Value Date   TSH 1.00 08/21/2014   Lab Results  Component Value Date   WBC 3.9 (L) 08/21/2014   HGB 14.3 08/21/2014   HCT 41.9 08/21/2014   MCV 81.9 08/21/2014   PLT 237.0 08/21/2014   Lab Results  Component Value Date   NA 138 01/06/2016   K 3.8 01/06/2016   CO2 31 01/06/2016   GLUCOSE 106 (H) 01/06/2016   BUN 17 01/06/2016   CREATININE 1.17 01/06/2016   BILITOT 0.6 08/21/2014   ALKPHOS 35 (L) 08/21/2014   AST 15 08/21/2014   ALT 17 08/21/2014   PROT 7.1 08/21/2014   ALBUMIN 4.5 08/21/2014   CALCIUM 9.4 01/06/2016   GFR 72.41 01/06/2016   Lab Results  Component Value Date   CHOL 214 (H) 01/06/2016   Lab Results  Component Value Date   HDL 38.60 (L) 01/06/2016   Lab Results  Component Value Date   LDLCALC 136 (H) 01/06/2016   Lab Results  Component Value Date   TRIG 195.0 (H) 01/06/2016   Lab Results  Component Value Date   CHOLHDL 6 01/06/2016   No results found for: HGBA1C       Assessment & Plan:   Problem List Items Addressed This Visit    None    Visit Diagnoses    Low back pain with radiation    -  Primary   Relevant Medications   cyclobenzaprine (FLEXERIL) 10 MG tablet   diclofenac (VOLTAREN) 75 MG EC tablet    alt ice and heat F/u 2 weeks or sooner if pain worsens or does not improve  I have discontinued Kevyn A. Ullery's pantoprazole. I am also having him start on cyclobenzaprine and diclofenac. Additionally, I am having him  maintain his multivitamin and sildenafil.  Meds ordered this encounter  Medications  . cyclobenzaprine (FLEXERIL) 10 MG tablet    Sig: Take 1 tablet (10 mg total) by mouth 3 (three) times daily as needed for muscle spasms.    Dispense:  30 tablet    Refill:  0  . diclofenac (VOLTAREN) 75 MG EC tablet    Sig: Take 1 tablet (75 mg total) by mouth 2 (two) times daily.    Dispense:  30 tablet    Refill:  0    CMA served as scribe during this visit. History, Physical and Plan performed by medical provider. Documentation and orders reviewed and attested to.  Ann Held, DO

## 2017-08-27 NOTE — Patient Instructions (Signed)

## 2017-10-31 ENCOUNTER — Encounter: Payer: Self-pay | Admitting: Internal Medicine

## 2017-10-31 ENCOUNTER — Ambulatory Visit (INDEPENDENT_AMBULATORY_CARE_PROVIDER_SITE_OTHER): Payer: 59 | Admitting: Internal Medicine

## 2017-10-31 VITALS — BP 124/72 | HR 65 | Temp 97.9°F | Resp 16 | Ht 69.0 in | Wt 169.1 lb

## 2017-10-31 DIAGNOSIS — Z8249 Family history of ischemic heart disease and other diseases of the circulatory system: Secondary | ICD-10-CM | POA: Diagnosis not present

## 2017-10-31 DIAGNOSIS — Z0000 Encounter for general adult medical examination without abnormal findings: Secondary | ICD-10-CM

## 2017-10-31 NOTE — Progress Notes (Signed)
Pre visit review using our clinic review tool, if applicable. No additional management support is needed unless otherwise documented below in the visit note. 

## 2017-10-31 NOTE — Patient Instructions (Signed)
GO TO THE LAB : Get the blood work     GO TO THE FRONT DESK Schedule labs to be done fasting in the next few days  Schedule your next appointment for a physical exam in 1 year   Use the mouthpiece every night  GENERAL INFORMATION ABOUT HEALTHY EATING, CHOLESTEROL :  The American Heart Association, www.heart.org  Check the "Life's Simple 7" from the [facility] The American diabetes Association www.diabetes.org  "[facility] [place]" book

## 2017-10-31 NOTE — Progress Notes (Signed)
Subjective:    Patient ID: Scott Sexton, male    DOB: 02-21-1971, 47 y.o.   MRN: [id number]  DOS:  10/31/2017 Type of visit - description : CPX  interval history: In general feels well   Review of Systems Reports that he feels tired frequently and occasionally feels sleepy.  Admits to snoring, has a mouthpiece that helps. He has a stressful job, not exercising much mostly because he is really tired after he comes back home from work.  Other than above, a 14 point review of systems is negative      Past Medical History:  Diagnosis Date  . Gastritis    EGD 08/2007, H.Pylori positive, status post treatment  . GERD (gastroesophageal reflux disease)     Past Surgical History:  Procedure Laterality Date  . TONSILLECTOMY      Social History   Socioeconomic History  . Marital status: Married    Spouse name: Not on file  . Number of children: 1  . Years of education: Not on file  . Highest education level: Not on file  Occupational History  . Occupation: Art gallery manager @ [employer]  . Financial resource strain: Not on file  . Food insecurity:    Worry: Not on file    Inability: Not on file  . Transportation needs:    Medical: Not on file    Non-medical: Not on file  Tobacco Use  . Smoking status: Never Smoker  . Smokeless tobacco: Never Used  Substance and Sexual Activity  . Alcohol use: Yes    Comment: socially  . Drug use: No  . Sexual activity: Not on file  Lifestyle  . Physical activity:    Days per week: Not on file    Minutes per session: Not on file  . Stress: Not on file  Relationships  . Social connections:    Talks on phone: Not on file    Gets together: Not on file    Attends religious service: Not on file    Active member of club or organization: Not on file    Attends meetings of clubs or organizations: Not on file    Relationship status: Not on file  . Intimate partner violence:    Fear of current or ex partner: Not on file      Emotionally abused: Not on file    Physically abused: Not on file    Forced sexual activity: Not on file  Other Topics Concern  . Not on file  Social History Narrative   Originally from [place],    Married, 1 son, boy, 2005       Family History  Problem Relation Age of Onset  . Diabetes Mother   . Hypertension Mother   . Coronary artery disease Mother        onset in her 32s, had an angioplasty in 6  . Stroke Mother        ?  . Coronary artery disease Other        uncle  . Colon cancer Neg Hx   . Prostate cancer Neg Hx    Family History  Problem Relation Age of Onset  . Diabetes Mother   . Hypertension Mother   . Coronary artery disease Mother        onset in her 73s, had an angioplasty in 56  . Stroke Mother        ?  . Coronary artery disease Other  uncle  . Colon cancer Neg Hx   . Prostate cancer Neg Hx      Allergies as of 10/31/2017   No Known Allergies     Medication List        Accurate as of 10/31/17 11:59 PM. Always use your most recent med list.          cyclobenzaprine 10 MG tablet Commonly known as:  FLEXERIL Take 1 tablet (10 mg total) by mouth 3 (three) times daily as needed for muscle spasms.   diclofenac 75 MG EC tablet Commonly known as:  VOLTAREN Take 1 tablet (75 mg total) by mouth 2 (two) times daily.   multivitamin tablet Take 1 tablet by mouth daily.   sildenafil 20 MG tablet Commonly known as:  REVATIO Take 3-4 tablets by mouth at bedtime as needed          Objective:   Physical Exam BP 124/72 (BP Location: Left Arm, Patient Position: Sitting, Cuff Size: Small)   Pulse 65   Temp 97.9 F (36.6 C) (Oral)   Resp 16   Ht 5\' 9"  (1.753 m)   Wt 169 lb 2 oz (76.7 kg)   SpO2 97%   BMI 24.98 kg/m  General: Well developed, NAD, see BMI.  Neck: No  thyromegaly  HEENT:  Normocephalic . Face symmetric, atraumatic Lungs:  CTA B Normal respiratory effort, no intercostal retractions, no accessory muscle use. Heart:  RRR,  no murmur.  No pretibial edema bilaterally  Abdomen:  Not distended, soft, non-tender. No rebound or rigidity.   Skin: Exposed areas without rash. Not pale. Not jaundice Neurologic:  alert & oriented X3.  Speech normal, gait appropriate for age and unassisted Strength symmetric and appropriate for age.  Psych: Cognition and judgment appear intact.  Cooperative with normal attention span and concentration.  Behavior appropriate. No anxious or depressed appearing.     Assessment & Plan:    Assessment H. pylori + gastritis per EGD 2009, S/P Rx GERD Varicocele. Re-eaval by urology 2016, [place] confirmed dx, L>R  PLAN: + FH CAD: Patient remains somewhat concerned, last year I recommended a stress test but that was not pursued.  Recommend a calcium coronary score.  (Cardiology referral) Fatigue, sleepy: His Epworth scale is 7, average, he does snore, has a mouthpiece, recommend consistent use of the mouthpiece to decrease his snoring.  Call if symptoms increase. Back pain: Was seen acutely few weeks ago, better, will use diclofenac and muscle relaxant as needed RTC 1 year

## 2017-10-31 NOTE — Assessment & Plan Note (Addendum)
Td  07-2010 at the ER Labs : Will RTC fasting for CMP, FLP, CBC, TSH, A1c EKG: I RBBB, at baseline + FH CAD: See comments under plan rec a healthy diet, increase exercise

## 2017-11-01 NOTE — Assessment & Plan Note (Signed)
+   FH CAD: Patient remains somewhat concerned, last year I recommended a stress test but that was not pursued.  Recommend a calcium coronary score.  (Cardiology referral) Fatigue, sleepy: His Epworth scale is 7, average, he does snore, has a mouthpiece, recommend consistent use of the mouthpiece to decrease his snoring.  Call if symptoms increase. Back pain: Was seen acutely few weeks ago, better, will use diclofenac and muscle relaxant as needed RTC 1 year

## 2017-11-02 ENCOUNTER — Other Ambulatory Visit: Payer: 59

## 2017-11-05 ENCOUNTER — Other Ambulatory Visit: Payer: Self-pay | Admitting: Internal Medicine

## 2017-11-05 DIAGNOSIS — M5440 Lumbago with sciatica, unspecified side: Principal | ICD-10-CM

## 2017-11-05 DIAGNOSIS — M545 Low back pain, unspecified: Secondary | ICD-10-CM

## 2017-11-05 NOTE — Telephone Encounter (Signed)
Copied from [place] [id number]. Topic: Quick Communication - Rx Refill/Question >> Nov 05, 2017 11:40 AM Oliver Pila B wrote: Medication: cyclobenzaprine (FLEXERIL) 10 MG tablet [[id number]], diclofenac (VOLTAREN) 75 MG EC tablet [[id number]]   Has the patient contacted their pharmacy? Yes.   (Agent: If no, request that the patient contact the pharmacy for the refill.) (Agent: If yes, when and what did the pharmacy advise?)  Preferred Pharmacy (with phone number or street name): [facility]  Agent: Please be advised that RX refills may take up to 3 business days. We ask that you follow-up with your pharmacy.  Medicaiton: sildenafil (REVATIO) 20 MG tablet [[id number]]   Pharmacy: [facility]

## 2017-11-05 NOTE — Telephone Encounter (Signed)
Flexeril and Dicofenac refills Last OV:10/31/17 Last refill:08/27/17 30 tab/0 refill for both PCP:[facility]: [facility] [address], [address] AT [address] [phone] (Phone) [phone] (Fax)

## 2017-11-06 MED ORDER — CYCLOBENZAPRINE HCL 10 MG PO TABS: 10.0000 mg | ORAL_TABLET | Freq: Three times a day (TID) | ORAL | 0 refills | Status: DC | PRN

## 2017-11-06 MED ORDER — DICLOFENAC SODIUM 75 MG PO TBEC: 75.0000 mg | DELAYED_RELEASE_TABLET | Freq: Two times a day (BID) | ORAL | 0 refills | Status: DC

## 2017-11-06 NOTE — Telephone Encounter (Signed)
Pt is requesting refill on Flexeril and diclofenac. Last filled: 08/27/2017 for both. Please advise.

## 2017-11-06 NOTE — Telephone Encounter (Signed)
Was recommended to take the medications as needed, RF sent but if he continues with pain will need to be seen

## 2018-03-14 ENCOUNTER — Other Ambulatory Visit (INDEPENDENT_AMBULATORY_CARE_PROVIDER_SITE_OTHER): Payer: 59

## 2018-03-14 DIAGNOSIS — Z0000 Encounter for general adult medical examination without abnormal findings: Secondary | ICD-10-CM | POA: Diagnosis not present

## 2018-03-14 LAB — COMPREHENSIVE METABOLIC PANEL
ALBUMIN: 5.1 g/dL (ref 3.5–5.2)
ALT: 31 U/L (ref 0–53)
AST: 17 U/L (ref 0–37)
Alkaline Phosphatase: 32 U/L — ABNORMAL LOW (ref 39–117)
BUN: 22 mg/dL (ref 6–23)
CALCIUM: 10.2 mg/dL (ref 8.4–10.5)
CHLORIDE: 100 meq/L (ref 96–112)
CO2: 29 meq/L (ref 19–32)
Creatinine, Ser: 1.11 mg/dL (ref 0.40–1.50)
GFR: 76.17 mL/min (ref >60.00)
Glucose, Bld: 100 mg/dL — ABNORMAL HIGH (ref 70–99)
POTASSIUM: 4.1 meq/L (ref 3.5–5.1)
SODIUM: 138 meq/L (ref 135–145)
Total Bilirubin: 0.6 mg/dL (ref 0.2–1.2)
Total Protein: 7.3 g/dL (ref 6.0–8.3)

## 2018-03-14 LAB — CBC WITH DIFFERENTIAL/PLATELET
Basophils Absolute: 0 10*3/uL (ref 0.0–0.1)
Basophils Relative: 0.9 % (ref 0.0–3.0)
EOS PCT: 2.2 % (ref 0.0–5.0)
Eosinophils Absolute: 0.1 10*3/uL (ref 0.0–0.7)
HCT: 44.4 % (ref 39.0–52.0)
HEMOGLOBIN: 15.1 g/dL (ref 13.0–17.0)
LYMPHS PCT: 50.9 % — AB (ref 12.0–46.0)
Lymphs Abs: 2.7 10*3/uL (ref 0.7–4.0)
MCHC: 34 g/dL (ref 30.0–36.0)
MCV: 83.1 fl (ref 78.0–100.0)
MONOS PCT: 7.3 % (ref 3.0–12.0)
Monocytes Absolute: 0.4 10*3/uL (ref 0.1–1.0)
NEUTROS PCT: 38.7 % — AB (ref 43.0–77.0)
Neutro Abs: 2 10*3/uL (ref 1.4–7.7)
Platelets: 252 10*3/uL (ref 150.0–400.0)
RBC: 5.34 Mil/uL (ref 4.22–5.81)
RDW: 12.9 % (ref 11.5–15.5)
WBC: 5.2 10*3/uL (ref 4.0–10.5)

## 2018-03-14 LAB — HEMOGLOBIN A1C: Hgb A1c MFr Bld: 6 % (ref 4.6–6.5)

## 2018-03-14 LAB — LDL CHOLESTEROL, DIRECT: LDL DIRECT: 150 mg/dL

## 2018-03-14 LAB — TSH: TSH: 1.6 u[IU]/mL (ref 0.35–4.50)

## 2018-03-14 LAB — LIPID PANEL
CHOL/HDL RATIO: 5
Cholesterol: 219 mg/dL — ABNORMAL HIGH (ref 0–200)
HDL: 41.3 mg/dL (ref >39.00)
NonHDL: 178.19
Triglycerides: 233 mg/dL — ABNORMAL HIGH (ref 0.0–149.0)
VLDL: 46.6 mg/dL — AB (ref 0.0–40.0)

## 2018-06-27 ENCOUNTER — Other Ambulatory Visit: Payer: Self-pay | Admitting: Internal Medicine

## 2018-06-27 DIAGNOSIS — M5440 Lumbago with sciatica, unspecified side: Principal | ICD-10-CM

## 2018-06-27 DIAGNOSIS — M545 Low back pain, unspecified: Secondary | ICD-10-CM

## 2018-06-28 ENCOUNTER — Other Ambulatory Visit: Payer: Self-pay | Admitting: Internal Medicine

## 2018-06-28 DIAGNOSIS — M545 Low back pain, unspecified: Secondary | ICD-10-CM

## 2018-06-28 DIAGNOSIS — M5440 Lumbago with sciatica, unspecified side: Principal | ICD-10-CM

## 2018-06-28 MED ORDER — CYCLOBENZAPRINE HCL 10 MG PO TABS: 10.0000 mg | ORAL_TABLET | Freq: Three times a day (TID) | ORAL | 0 refills | Status: DC | PRN

## 2018-06-28 MED ORDER — DICLOFENAC SODIUM 75 MG PO TBEC: 75.0000 mg | DELAYED_RELEASE_TABLET | Freq: Two times a day (BID) | ORAL | 0 refills | Status: DC

## 2018-06-28 MED ORDER — SILDENAFIL CITRATE 20 MG PO TABS: ORAL_TABLET | ORAL | 3 refills | Status: DC

## 2018-06-28 NOTE — Telephone Encounter (Signed)
Please review TE regarding that there are 2 different pharmacies for this patient.

## 2018-06-28 NOTE — Telephone Encounter (Signed)
Copied from [place] [id number]. Topic: Quick Communication - Rx Refill/Question >> Jun 28, 2018 12:57 PM Scherrie Gerlach wrote: Medication:  1. diclofenac (VOLTAREN) 75 MG EC tablet cyclobenzaprine (FLEXERIL) 10 MG tablet sildenafil (REVATIO) 20 MG tablet Pt states he has hurt his back again and requesting the above meds sent to  [facility], [address] AT [address] [phone] (Phone) [phone] (Fax)  2. sildenafil (REVATIO) 20 MG tablet This sent to: [facility], [place].

## 2018-06-28 NOTE — Telephone Encounter (Signed)
Requested medication (s) are due for refill today: yes  Requested medication (s) are on the active medication list: yes  Last refill:  11/06/17 #30  Future visit scheduled: No  Notes to clinic:  Unable to refill per protocol    Requested Prescriptions  Pending Prescriptions Disp Refills   cyclobenzaprine (FLEXERIL) 10 MG tablet 30 tablet 0    Sig: Take 1 tablet (10 mg total) by mouth 3 (three) times daily as needed for muscle spasms.     Not Delegated - Analgesics:  Muscle Relaxants Failed - 06/28/2018  1:28 PM      Failed - This refill cannot be delegated      Failed - Valid encounter within last 6 months    Recent Outpatient Visits          8 months ago Annual physical exam   [facility], [place]   10 months ago Low back pain with radiation   [facility] [place], [place], DO   1 year ago Family history of heart disease   [facility] [place], [place]   2 years ago Annual physical exam   [facility] [place], MD   3 years ago Annual physical exam   [facility] [place], MD           Signed Prescriptions Disp Refills   sildenafil (REVATIO) 20 MG tablet 30 tablet 3    Sig: Take 3-4 tablets by mouth at bedtime as needed     Urology: Erectile Dysfunction Agents Passed - 06/28/2018  1:28 PM      Passed - Last BP in normal range    BP Readings from Last 1 Encounters:  10/31/17 124/72         Passed - Valid encounter within last 12 months    Recent Outpatient Visits          8 months ago Annual physical exam   [facility] [place], MD   10 months ago Low back pain with radiation   [facility], [place], DO   1 year ago Family history of heart disease    [facility] [place], MD   2 years ago Annual physical exam   [facility] [place], [place]   3 years ago Annual physical exam   [facility] [place], MD            diclofenac (VOLTAREN) 75 MG EC tablet 30 tablet 0    Sig: Take 1 tablet (75 mg total) by mouth 2 (two) times daily.     Analgesics:  NSAIDS Passed - 06/28/2018  1:28 PM      Passed - Cr in normal range and within 360 days    Creatinine, Ser  Date Value Ref Range Status  03/14/2018 1.11 0.40 - 1.50 mg/dL Final         Passed - HGB in normal range and within 360 days    Hemoglobin  Date Value Ref Range Status  03/14/2018 15.1 13.0 - 17.0 g/dL Final         Passed -  Patient is not pregnant      Passed - Valid encounter within last 12 months    Recent Outpatient Visits          8 months ago Annual physical exam   [facility] [place], MD   10 months ago Low back pain with radiation   [facility] [place], [place], DO   1 year ago Family history of heart disease   [facility] [place], MD   2 years ago Annual physical exam   [facility] [place], [place]   3 years ago Annual physical exam   [facility] [place], [place]

## 2018-10-01 DIAGNOSIS — H6983 Other specified disorders of Eustachian tube, bilateral: Secondary | ICD-10-CM | POA: Diagnosis not present

## 2018-10-01 DIAGNOSIS — H9313 Tinnitus, bilateral: Secondary | ICD-10-CM | POA: Diagnosis not present

## 2018-10-01 DIAGNOSIS — J310 Chronic rhinitis: Secondary | ICD-10-CM | POA: Diagnosis not present

## 2018-12-10 ENCOUNTER — Encounter: Payer: Self-pay | Admitting: Internal Medicine

## 2018-12-25 ENCOUNTER — Other Ambulatory Visit: Payer: Self-pay

## 2018-12-25 ENCOUNTER — Other Ambulatory Visit: Payer: Self-pay | Admitting: Internal Medicine

## 2018-12-25 ENCOUNTER — Ambulatory Visit (INDEPENDENT_AMBULATORY_CARE_PROVIDER_SITE_OTHER): Payer: 59 | Admitting: Internal Medicine

## 2018-12-25 DIAGNOSIS — M5440 Lumbago with sciatica, unspecified side: Secondary | ICD-10-CM

## 2018-12-25 DIAGNOSIS — M545 Low back pain, unspecified: Secondary | ICD-10-CM

## 2018-12-25 MED ORDER — CYCLOBENZAPRINE HCL 10 MG PO TABS: 10.0000 mg | ORAL_TABLET | Freq: Three times a day (TID) | ORAL | 1 refills | Status: DC | PRN

## 2018-12-25 MED ORDER — DICLOFENAC SODIUM 75 MG PO TBEC: 75.0000 mg | DELAYED_RELEASE_TABLET | Freq: Two times a day (BID) | ORAL | 1 refills | Status: DC | PRN

## 2018-12-25 NOTE — Progress Notes (Signed)
Subjective:    Patient ID: Scott Sexton, male    DOB: [birth year], 48 y.o.   MRN: [id number]  DOS:  12/25/2018 Type of visit - description: Virtual Visit via Video Note  I connected with@   by a video enabled telemedicine application and verified that I am speaking with the correct person using two identifiers.   THIS ENCOUNTER IS A VIRTUAL VISIT DUE TO COVID-19 - PATIENT WAS NOT SEEN IN THE OFFICE. PATIENT HAS CONSENTED TO VIRTUAL VISIT / TELEMEDICINE VISIT   Location of patient: home  Location of provider: office  I discussed the limitations of evaluation and management by telemedicine and the availability of in person appointments. The patient expressed understanding and agreed to proceed.  History of Present Illness: Acute From time to time, he has lower back pain, often times after simply bending and picking up something that is not heavy. Last episode was 4 weeks ago and he is already better. He responds well to diclofenac and Flexeril and request a refill. Interestingly, sometimes he does heavy yard work without any problems.  High cholesterol: Decided not to take Lipitor    Review of Systems  Denies lower extremity numbness or tingling No actual injury or fall.  Past Medical History:  Diagnosis Date  . Gastritis    EGD 08/2007, H.Pylori positive, status post treatment  . GERD (gastroesophageal reflux disease)     Past Surgical History:  Procedure Laterality Date  . TONSILLECTOMY      Social History   Socioeconomic History  . Marital status: Married    Spouse name: Not on file  . Number of children: 1  . Years of education: Not on file  . Highest education level: Not on file  Occupational History  . Occupation: Art gallery manager @ [employer]  . Financial resource strain: Not on file  . Food insecurity    Worry: Not on file    Inability: Not on file  . Transportation needs    Medical: Not on file    Non-medical: Not on file  Tobacco Use   . Smoking status: Never Smoker  . Smokeless tobacco: Never Used  Substance and Sexual Activity  . Alcohol use: Yes    Comment: socially  . Drug use: No  . Sexual activity: Not on file  Lifestyle  . Physical activity    Days per week: Not on file    Minutes per session: Not on file  . Stress: Not on file  Relationships  . Social Herbalist on phone: Not on file    Gets together: Not on file    Attends religious service: Not on file    Active member of club or organization: Not on file    Attends meetings of clubs or organizations: Not on file    Relationship status: Not on file  . Intimate partner violence    Fear of current or ex partner: Not on file    Emotionally abused: Not on file    Physically abused: Not on file    Forced sexual activity: Not on file  Other Topics Concern  . Not on file  Social History Narrative   Originally from [place],    Married, 1 son, boy, 2005          Allergies as of 12/25/2018   No Known Allergies     Medication List       Accurate as of December 25, 2018  3:52 PM.  If you have any questions, ask your nurse or doctor.        cyclobenzaprine 10 MG tablet Commonly known as: FLEXERIL Take 1 tablet (10 mg total) by mouth 3 (three) times daily as needed for muscle spasms.   diclofenac 75 MG EC tablet Commonly known as: VOLTAREN Take 1 tablet (75 mg total) by mouth 2 (two) times daily.   multivitamin tablet Take 1 tablet by mouth daily.   sildenafil 20 MG tablet Commonly known as: REVATIO TAKE 3-4 TABLETS AT BEDTIME AS NEEDED.           Objective:   Physical Exam There were no vitals taken for this visit. This is a virtual video visit.  Alert oriented x3, no apparent distress    Assessment      Assessment H. pylori + gastritis per EGD 2009, S/P Rx GERD Varicocele. Re-eaval by urology 2016, [place] confirmed dx, L>R  PLAN: Episodic back pain: I will refill Flexeril and diclofenac which he takes sporadically,  remind him about GI precautions ; Flexeril can cause drowsiness. We discussed possibly physical therapy referral for core muscle strengthening but he declines at this point.  Explained benefits of PT (prevention of pain). + FH CAD, elevated cholesterol: See last FLP, patient declines statins. COVID-19: Taking good precautions Recommend to call and schedule a CPX at his convenience    I discussed the assessment and treatment plan with the patient. The patient was provided an opportunity to ask questions and all were answered. The patient agreed with the plan and demonstrated an understanding of the instructions.   The patient was advised to call back or seek an in-person evaluation if the symptoms worsen or if the condition fails to improve as anticipated.

## 2018-12-26 NOTE — Assessment & Plan Note (Signed)
Episodic back pain: I will refill Flexeril and diclofenac which he takes sporadically, remind him about GI precautions ; Flexeril can cause drowsiness. We discussed possibly physical therapy referral for core muscle strengthening but he declines at this point.  Explained benefits of PT (prevention of pain). + FH CAD, elevated cholesterol: See last FLP, patient declines statins. COVID-19: Taking good precautions Recommend to call and schedule a CPX at his convenience

## 2019-09-13 ENCOUNTER — Other Ambulatory Visit: Payer: Self-pay | Admitting: Internal Medicine

## 2019-11-18 LAB — COMPREHENSIVE METABOLIC PANEL
Albumin: 5 (ref 3.5–5.0)
Calcium: 10 (ref 8.7–10.7)
GFR calc Af Amer: 87
GFR calc non Af Amer: 72

## 2019-11-18 LAB — HEPATIC FUNCTION PANEL
ALT: 85 — AB (ref 10–40)
AST: 46 — AB (ref 14–40)
Alkaline Phosphatase: 36 (ref 25–125)
Bilirubin, Total: 0.5

## 2019-11-18 LAB — BASIC METABOLIC PANEL
BUN: 17 (ref 4–21)
CO2: 30 — AB (ref 13–22)
Chloride: 101 (ref 99–108)
Creatinine: 1.1 (ref 0.6–1.3)
Glucose: 93
Potassium: 5.7 — AB (ref 3.4–5.3)
Sodium: 136 — AB (ref 137–147)

## 2019-11-19 ENCOUNTER — Other Ambulatory Visit: Payer: Self-pay | Admitting: Physician Assistant

## 2019-11-19 DIAGNOSIS — R1013 Epigastric pain: Secondary | ICD-10-CM

## 2019-11-21 ENCOUNTER — Other Ambulatory Visit: Payer: Self-pay | Admitting: Internal Medicine

## 2019-11-21 DIAGNOSIS — M545 Low back pain, unspecified: Secondary | ICD-10-CM

## 2019-11-25 ENCOUNTER — Other Ambulatory Visit: Payer: 59

## 2019-11-26 ENCOUNTER — Encounter: Payer: Self-pay | Admitting: Internal Medicine

## 2019-11-26 ENCOUNTER — Ambulatory Visit (INDEPENDENT_AMBULATORY_CARE_PROVIDER_SITE_OTHER): Payer: 59 | Admitting: Internal Medicine

## 2019-11-26 ENCOUNTER — Other Ambulatory Visit: Payer: Self-pay

## 2019-11-26 VITALS — BP 125/84 | HR 85 | Temp 98.1°F | Resp 16 | Ht 69.0 in | Wt 181.5 lb

## 2019-11-26 DIAGNOSIS — R1013 Epigastric pain: Secondary | ICD-10-CM | POA: Diagnosis not present

## 2019-11-26 DIAGNOSIS — R739 Hyperglycemia, unspecified: Secondary | ICD-10-CM | POA: Diagnosis not present

## 2019-11-26 DIAGNOSIS — R1011 Right upper quadrant pain: Secondary | ICD-10-CM

## 2019-11-26 DIAGNOSIS — Z0000 Encounter for general adult medical examination without abnormal findings: Secondary | ICD-10-CM | POA: Diagnosis not present

## 2019-11-26 NOTE — Patient Instructions (Addendum)
  Will arrange a ultrasound   GO TO THE FRONT DESK, Craig back for  blood work at your earliest convenience , fasting   Come back for a physical exam in 1 year

## 2019-11-26 NOTE — Progress Notes (Signed)
Subjective:    Patient ID: Scott Sexton, male    DOB: 1970/09/10, 49 y.o.   MRN: [id number]  DOS:  11/26/2019 Type of visit - description: CPX Here for CPX.  Request a GI referral.  Started with epigastric discomfort and  heartburn few months ago, self prescribed PPIs OTC, not better.  Would like to see GI for possible EGD. Denies weight loss, nausea or vomiting.  No diarrhea No dysphagia or odynophagia. No blood in the stools. Pain sometimes is also located at the RUQ. Symptoms were typically postprandial.  Review of Systems  Other than above, a 14 point review of systems is negative      Past Medical History:  Diagnosis Date  . Gastritis    EGD 08/2007, H.Pylori positive, status post treatment  . GERD (gastroesophageal reflux disease)     Past Surgical History:  Procedure Laterality Date  . TONSILLECTOMY      Allergies as of 11/26/2019   No Known Allergies     Medication List       Accurate as of November 26, 2019 11:59 PM. If you have any questions, ask your nurse or doctor.        cyclobenzaprine 10 MG tablet Commonly known as: FLEXERIL Take 1 tablet (10 mg total) by mouth 3 (three) times daily as needed for muscle spasms.   diclofenac 75 MG EC tablet Commonly known as: VOLTAREN Take 1 tablet (75 mg total) by mouth 2 (two) times daily as needed for mild pain.   esomeprazole 40 MG capsule Commonly known as: NEXIUM Take 40 mg by mouth daily at 12 noon.   multivitamin tablet Take 1 tablet by mouth daily.   sildenafil 20 MG tablet Commonly known as: REVATIO TAKE 3-4 TABLETS AT BEDTIME AS NEEDED.          Objective:   Physical Exam BP 125/84 (BP Location: Left Arm, Patient Position: Sitting, Cuff Size: Small)   Pulse 85   Temp 98.1 F (36.7 C) (Oral)   Resp 16   Ht 5\' 9"  (1.753 m)   Wt 181 lb 8 oz (82.3 kg)   SpO2 96%   BMI 26.80 kg/m  General: Well developed, NAD, BMI noted Neck: No  thyromegaly  HEENT:  Normocephalic . Face symmetric,  atraumatic Lungs:  CTA B Normal respiratory effort, no intercostal retractions, no accessory muscle use. Heart: RRR,  no murmur.  Abdomen:  Not distended, soft, non-tender. No rebound or rigidity.   Lower extremities: no pretibial edema bilaterally  Skin: Exposed areas without rash. Not pale. Not jaundice Neurologic:  alert & oriented X3.  Speech normal, gait appropriate for age and unassisted Strength symmetric and appropriate for age.  Psych: Cognition and judgment appear intact.  Cooperative with normal attention span and concentration.  Behavior appropriate. No anxious or depressed appearing.     Assessment     Assessment Hyperglycemia: A1c 6.0 (2019). H. pylori + gastritis per EGD 2009, S/P Rx GERD Varicocele. Re-eaval by urology 2016, [place] confirmed dx, L>R  PLAN: Here for CPX Hyperglycemia: check A1c GERD:  Associated with epigastric pain, self restarted OTC PPI few months ago, not improving much, occasional pain at the RUQ. He already saw [facility] provider and was recommended a EGD however he prefers to be seen by [facility], [facility].   Dr. Deatra Ina did endoscopies years ago. Plan: Continue PPIs, ultrasound abdomen, refer to [facility] + FH CAD: Again discussed possible need for statins, he remains quite reluctant to proceed. Episodic back  pain: On diclofenac but takes it once or twice a month only, doubt GI symptoms are NSAID related. RTC 1 year      This visit occurred during the SARS-CoV-2 public health emergency.  Safety protocols were in place, including screening questions prior to the visit, additional usage of staff PPE, and extensive cleaning of exam room while observing appropriate contact time as indicated for disinfecting solutions.

## 2019-11-26 NOTE — Progress Notes (Signed)
Pre visit review using our clinic review tool, if applicable. No additional management support is needed unless otherwise documented below in the visit note. 

## 2019-11-27 ENCOUNTER — Encounter: Payer: Self-pay | Admitting: Gastroenterology

## 2019-11-27 ENCOUNTER — Other Ambulatory Visit (INDEPENDENT_AMBULATORY_CARE_PROVIDER_SITE_OTHER): Payer: 59

## 2019-11-27 ENCOUNTER — Encounter: Payer: Self-pay | Admitting: Internal Medicine

## 2019-11-27 DIAGNOSIS — R739 Hyperglycemia, unspecified: Secondary | ICD-10-CM

## 2019-11-27 DIAGNOSIS — Z0000 Encounter for general adult medical examination without abnormal findings: Secondary | ICD-10-CM | POA: Diagnosis not present

## 2019-11-27 LAB — COMPREHENSIVE METABOLIC PANEL
ALT: 78 U/L — ABNORMAL HIGH (ref 0–53)
AST: 31 U/L (ref 0–37)
Albumin: 4.9 g/dL (ref 3.5–5.2)
Alkaline Phosphatase: 36 U/L — ABNORMAL LOW (ref 39–117)
BUN: 22 mg/dL (ref 6–23)
CO2: 29 mEq/L (ref 19–32)
Calcium: 9.9 mg/dL (ref 8.4–10.5)
Chloride: 101 mEq/L (ref 96–112)
Creatinine, Ser: 1.22 mg/dL (ref 0.40–1.50)
GFR: 63.78 mL/min (ref >60.00)
Glucose, Bld: 108 mg/dL — ABNORMAL HIGH (ref 70–99)
Potassium: 4.2 mEq/L (ref 3.5–5.1)
Sodium: 136 mEq/L (ref 135–145)
Total Bilirubin: 0.5 mg/dL (ref 0.2–1.2)
Total Protein: 7.3 g/dL (ref 6.0–8.3)

## 2019-11-27 LAB — CBC WITH DIFFERENTIAL/PLATELET
Basophils Absolute: 0 K/uL (ref 0.0–0.1)
Basophils Relative: 0.9 % (ref 0.0–3.0)
Eosinophils Absolute: 0.2 K/uL (ref 0.0–0.7)
Eosinophils Relative: 2.8 % (ref 0.0–5.0)
HCT: 44.2 % (ref 39.0–52.0)
Hemoglobin: 14.8 g/dL (ref 13.0–17.0)
Lymphocytes Relative: 51.3 % — ABNORMAL HIGH (ref 12.0–46.0)
Lymphs Abs: 2.8 K/uL (ref 0.7–4.0)
MCHC: 33.5 g/dL (ref 30.0–36.0)
MCV: 83.9 fl (ref 78.0–100.0)
Monocytes Absolute: 0.4 K/uL (ref 0.1–1.0)
Monocytes Relative: 7.1 % (ref 3.0–12.0)
Neutro Abs: 2.1 K/uL (ref 1.4–7.7)
Neutrophils Relative %: 37.9 % — ABNORMAL LOW (ref 43.0–77.0)
Platelets: 235 K/uL (ref 150.0–400.0)
RBC: 5.27 Mil/uL (ref 4.22–5.81)
RDW: 12.9 % (ref 11.5–15.5)
WBC: 5.4 K/uL (ref 4.0–10.5)

## 2019-11-27 LAB — LIPID PANEL
Cholesterol: 250 mg/dL — ABNORMAL HIGH (ref 0–200)
HDL: 33.4 mg/dL — ABNORMAL LOW (ref >39.00)
NonHDL: 216.64
Total CHOL/HDL Ratio: 7
Triglycerides: 272 mg/dL — ABNORMAL HIGH (ref 0.0–149.0)
VLDL: 54.4 mg/dL — ABNORMAL HIGH (ref 0.0–40.0)

## 2019-11-27 LAB — LDL CHOLESTEROL, DIRECT: Direct LDL: 159 mg/dL

## 2019-11-27 LAB — TSH: TSH: 0.98 u[IU]/mL (ref 0.35–4.50)

## 2019-11-27 LAB — HEMOGLOBIN A1C: Hgb A1c MFr Bld: 6.1 % (ref 4.6–6.5)

## 2019-11-27 NOTE — Assessment & Plan Note (Signed)
Here for CPX Hyperglycemia: check A1c GERD:  Associated with epigastric pain, self restarted OTC PPI few months ago, not improving much, occasional pain at the RUQ. He already saw [facility] provider and was recommended a EGD however he prefers to be seen by [place], [facility].   Dr. Deatra Ina did endoscopies years ago. Plan: Continue PPIs, ultrasound abdomen, refer to  GI + FH CAD: Again discussed possible need for statins, he remains quite reluctant to proceed. Episodic back pain: On diclofenac but takes it once or twice a month only, doubt GI symptoms are NSAID related. RTC 1 year

## 2019-11-27 NOTE — Assessment & Plan Note (Signed)
Td  07-2010 S/p covid vaccines  rec flu shot q year  + FH CAD: See comments under plan Labs: Will come back fasting CMP, FLP, CBC, A1c, TSH Diet exercise: Diet is okay, did not exercise for a year, plans to go back.

## 2019-12-03 ENCOUNTER — Other Ambulatory Visit: Payer: Self-pay

## 2019-12-03 ENCOUNTER — Ambulatory Visit (HOSPITAL_BASED_OUTPATIENT_CLINIC_OR_DEPARTMENT_OTHER)
Admission: RE | Admit: 2019-12-03 | Discharge: 2019-12-03 | Disposition: A | Discharge status: Home / Self Care | Payer: 59 | Source: Ambulatory Visit | Attending: Internal Medicine | Admitting: Internal Medicine

## 2019-12-03 DIAGNOSIS — R1013 Epigastric pain: Secondary | ICD-10-CM | POA: Diagnosis present

## 2019-12-03 DIAGNOSIS — R1011 Right upper quadrant pain: Secondary | ICD-10-CM | POA: Insufficient documentation

## 2019-12-12 ENCOUNTER — Encounter: Payer: Self-pay | Admitting: Internal Medicine

## 2020-01-28 ENCOUNTER — Encounter: Payer: Self-pay | Admitting: Gastroenterology

## 2020-01-28 ENCOUNTER — Ambulatory Visit (INDEPENDENT_AMBULATORY_CARE_PROVIDER_SITE_OTHER): Payer: 59 | Admitting: Gastroenterology

## 2020-01-28 DIAGNOSIS — K219 Gastro-esophageal reflux disease without esophagitis: Secondary | ICD-10-CM | POA: Diagnosis not present

## 2020-01-28 DIAGNOSIS — Z1211 Encounter for screening for malignant neoplasm of colon: Secondary | ICD-10-CM

## 2020-01-28 MED ORDER — ESOMEPRAZOLE MAGNESIUM 40 MG PO CPDR: 40.0000 mg | DELAYED_RELEASE_CAPSULE | Freq: Two times a day (BID) | ORAL | 3 refills | Status: DC

## 2020-01-28 MED ORDER — SUPREP BOWEL PREP KIT 17.5-3.13-1.6 GM/177ML PO SOLN
1.0000 | ORAL | 0 refills | Status: DC
Start: 2020-01-28 — End: 2020-03-10

## 2020-01-28 NOTE — Progress Notes (Signed)
Referring Provider: Colon Branch, MD Primary Care Physician:  Colon Branch, MD  Reason for Consultation:  Reflux   IMPRESSION:  GERD not responding to Nexium 40 mg QAM    - normal ultrasound and live enzymes History of H pylori gastritis 2009 No prior colon cancer screening  I have recommended an EGD with esophageal and gastric biopsies given the differential of reflux esophagitis, persistent H pylori, PUD, gastritis, non-erosive reflux disease, and less likely malignancy. Plan PPI BID. Reviewed lifestyle modifications. Consider TIF in the future if there is a desire to avoid medications in the future.   Colonoscopy recommended for colon cancer screening.   PLAN: Increase Nexium to 40 mg BID Reviewed GERD lifestyle modications EGD Colonoscopy for colon cancer screening  Please see the "Patient Instructions" section for addition details about the plan.  HPI: Scott Sexton is a 49 y.o. male referred by Dr. Larose Kells for further evaluation of reflux. He works as an Art gallery manager. He has a history of H pylori gastritis on EGD with Dr. Deatra Ina in 2009 that was treated with Prevpac. Remained asymptomatic until two years ago.   Resumed Nexium 40 mg QAM 2 years ago for recurrent symptoms. Despite daily adherence, he continues to have constant reflux symptoms. Has identified triggers including soda and alcohol.  Denies dysphagia, odynophagia, dysphonia, neck pain, sore throat, cough, change in bowel habits, blood in the stool, or nocturnal symptoms.  He and his wife have been having left ear pain, seen by ENT, and now responding to Zyrtec and a nasal spray. Seen by [facility] for similar symptoms. Endoscopy recommended but not scheduled.   Uses diclofenac for his back on rare occasions - every 2 weeks.  Denies the use of other NSAIDs.   Normal labs including CBC, CMP, TSH Abdominal ultrasound 12/03/19 showed possible fatty liver, was otherwise normal  He has never had a colonoscopy or colon  cancer screening.   Mother with heartburn. Father with colon polyps. No known family history of colon cancer or polyps. No family history of uterine/endometrial cancer, pancreatic cancer or gastric/stomach cancer.   Past Medical History:  Diagnosis Date  . Gastritis    EGD 08/2007, H.Pylori positive, status post treatment  . GERD (gastroesophageal reflux disease)     Past Surgical History:  Procedure Laterality Date  . TONSILLECTOMY      Current Outpatient Medications  Medication Sig Dispense Refill  . cyclobenzaprine (FLEXERIL) 10 MG tablet Take 1 tablet (10 mg total) by mouth 3 (three) times daily as needed for muscle spasms. 60 tablet 1  . diclofenac (VOLTAREN) 75 MG EC tablet Take 1 tablet (75 mg total) by mouth 2 (two) times daily as needed for mild pain. 60 tablet 1  . esomeprazole (NEXIUM) 40 MG capsule Take 40 mg by mouth daily at 12 noon. Patient takes medication in the morning    . Multiple Vitamin (MULTIVITAMIN) tablet Take 1 tablet by mouth daily.    . sildenafil (REVATIO) 20 MG tablet TAKE 3-4 TABLETS AT BEDTIME AS NEEDED. 30 tablet 1   No current facility-administered medications for this visit.    Allergies as of 01/28/2020  . (No Known Allergies)    Family History  Problem Relation Age of Onset  . Diabetes Mother   . Hypertension Mother   . Coronary artery disease Mother        onset in her 17s, had an angioplasty in 67  . Stroke Mother        ?  Marland Kitchen  Coronary artery disease Other        uncle  . Colon cancer Neg Hx   . Prostate cancer Neg Hx     Social History   Socioeconomic History  . Marital status: Married    Spouse name: Not on file  . Number of children: 1  . Years of education: Not on file  . Highest education level: Not on file  Occupational History  . Occupation: Art gallery manager @ [employer]   Tobacco Use  . Smoking status: Never Smoker  . Smokeless tobacco: Never Used  Vaping Use  . Vaping Use: Never used  Substance and Sexual  Activity  . Alcohol use: Yes    Comment: socially  . Drug use: No  . Sexual activity: Not on file  Other Topics Concern  . Not on file  Social History Narrative   Originally from [place],    Married, 1 son, boy, 2005       Social Determinants of Health   Financial Resource Strain:   . Difficulty of Paying Living Expenses: Not on file  Food Insecurity:   . Worried About Charity fundraiser in the Last Year: Not on file  . Ran Out of Food in the Last Year: Not on file  Transportation Needs:   . Lack of Transportation (Medical): Not on file  . Lack of Transportation (Non-Medical): Not on file  Physical Activity:   . Days of Exercise per Week: Not on file  . Minutes of Exercise per Session: Not on file  Stress:   . Feeling of Stress : Not on file  Social Connections:   . Frequency of Communication with Friends and Family: Not on file  . Frequency of Social Gatherings with Friends and Family: Not on file  . Attends Religious Services: Not on file  . Active Member of Clubs or Organizations: Not on file  . Attends Archivist Meetings: Not on file  . Marital Status: Not on file  Intimate Partner Violence:   . Fear of Current or Ex-Partner: Not on file  . Emotionally Abused: Not on file  . Physically Abused: Not on file  . Sexually Abused: Not on file    Review of Systems: 12 system ROS is negative except as noted above.   Physical Exam: General:   Alert,  well-nourished, pleasant and cooperative in NAD Head:  Normocephalic and atraumatic. Eyes:  Sclera clear, no icterus.   Conjunctiva pink. Ears:  Normal auditory acuity. Nose:  No deformity, discharge,  or lesions. Mouth:  No deformity or lesions.   Neck:  Supple; no masses or thyromegaly. Lungs:  Clear throughout to auscultation.   No wheezes. Heart:  Regular rate and rhythm; no murmurs. Abdomen:  Soft,nontender, nondistended, normal bowel sounds, no rebound or guarding. No hepatosplenomegaly.   Rectal:   Deferred  Msk:  Symmetrical. No boney deformities LAD: No inguinal or umbilical LAD Extremities:  No clubbing or edema. Neurologic:  Alert and  oriented x4;  grossly nonfocal Skin:  Intact without significant lesions or rashes. Psych:  Alert and cooperative. Normal mood and affect.    Duvan Mousel L. Tarri Glenn, MD, MPH 01/28/2020, 11:16 AM

## 2020-01-28 NOTE — Patient Instructions (Addendum)
If you are age 47 or older, your body mass index should be between 23-30. Your Body mass index is 26.99 kg/m. If this is out of the aforementioned range listed, please consider follow up with your Primary Care Provider.  If you are age 53 or younger, your body mass index should be between 19-25. Your Body mass index is 26.99 kg/m. If this is out of the aformentioned range listed, please consider follow up with your Primary Care Provider.   You have been scheduled for an endoscopy and colonoscopy. Please follow the written instructions given to you at your visit today. Please pick up your prep supplies at the pharmacy within the next 1-3 days. If you use inhalers (even only as needed), please bring them with you on the day of your procedure.  We have sent the following medications to your pharmacy for you to pick up at your convenience:   INCREASE: Nexium to 40mg  one capsule twice daily.  Thank you for entrusting me with your care and choosing [facility].  Dr Tarri Glenn

## 2020-02-11 ENCOUNTER — Telehealth: Payer: Self-pay | Admitting: Internal Medicine

## 2020-02-11 DIAGNOSIS — R7989 Other specified abnormal findings of blood chemistry: Secondary | ICD-10-CM

## 2020-02-11 NOTE — Telephone Encounter (Signed)
Please arrange for the following: Full panel LFTs, hep C, hepatitis B surface antigen, Hep B core antibody DX increased LFTs

## 2020-02-11 NOTE — Telephone Encounter (Signed)
Orders placed. Letter mailed to Pt instructing him to call office to schedule lab appt.

## 2020-03-10 ENCOUNTER — Ambulatory Visit (AMBULATORY_SURGERY_CENTER): Payer: 59 | Admitting: Gastroenterology

## 2020-03-10 ENCOUNTER — Encounter: Payer: Self-pay | Admitting: Gastroenterology

## 2020-03-10 ENCOUNTER — Other Ambulatory Visit: Payer: Self-pay

## 2020-03-10 VITALS — BP 114/69 | HR 65 | Temp 98.9°F | Resp 21 | Ht 68.0 in | Wt 177.0 lb

## 2020-03-10 DIAGNOSIS — K2289 Other specified disease of esophagus: Secondary | ICD-10-CM | POA: Diagnosis not present

## 2020-03-10 DIAGNOSIS — K200 Eosinophilic esophagitis: Secondary | ICD-10-CM

## 2020-03-10 DIAGNOSIS — Z1211 Encounter for screening for malignant neoplasm of colon: Secondary | ICD-10-CM

## 2020-03-10 DIAGNOSIS — D124 Benign neoplasm of descending colon: Secondary | ICD-10-CM | POA: Diagnosis not present

## 2020-03-10 DIAGNOSIS — K219 Gastro-esophageal reflux disease without esophagitis: Secondary | ICD-10-CM

## 2020-03-10 DIAGNOSIS — D122 Benign neoplasm of ascending colon: Secondary | ICD-10-CM

## 2020-03-10 DIAGNOSIS — D120 Benign neoplasm of cecum: Secondary | ICD-10-CM

## 2020-03-10 MED ORDER — SODIUM CHLORIDE 0.9 % IV SOLN: 500.0000 mL | Freq: Once | INTRAVENOUS | Status: DC

## 2020-03-10 MED ORDER — ESOMEPRAZOLE MAGNESIUM 40 MG PO CPDR: 40.0000 mg | DELAYED_RELEASE_CAPSULE | Freq: Every morning | ORAL | 3 refills | Status: DC

## 2020-03-10 NOTE — Op Note (Addendum)
[facility] Patient Name: Scott Sexton Procedure Date: 03/10/2020 2:08 PM MRN: [id number] Endoscopist: Thornton Park MD, MD Age: 49 Referring MD:  Date of Birth: 1970/11/18 Gender: Male Account #: [id number] Procedure:                Upper GI endoscopy Indications:              Esophageal reflux symptoms that persist despite                            appropriate therapy                           GERD not responding to Nexium 40 mg QAM                           - normal ultrasound and live enzymes                           History of H pylori gastritis 2009 Medicines:                Monitored Anesthesia Care Procedure:                Pre-Anesthesia Assessment:                           - Prior to the procedure, a History and Physical                            was performed, and patient medications and                            allergies were reviewed. The patient's tolerance of                            previous anesthesia was also reviewed. The risks                            and benefits of the procedure and the sedation                            options and risks were discussed with the patient.                            All questions were answered, and informed consent                            was obtained. Prior Anticoagulants: The patient has                            taken no previous anticoagulant or antiplatelet                            agents. ASA Grade Assessment: II - A patient with  mild systemic disease. After reviewing the risks                            and benefits, the patient was deemed in                            satisfactory condition to undergo the procedure.                           After obtaining informed consent, the endoscope was                            passed under direct vision. Throughout the                            procedure, the patient's blood pressure, pulse, and                             oxygen saturations were monitored continuously. The                            Endoscope was introduced through the mouth, and                            advanced to the third part of duodenum. The upper                            GI endoscopy was accomplished without difficulty.                            The patient tolerated the procedure well. Scope In: Scope Out: Findings:                 The examined esophagus was normal. Biopsies were                            obtained from the proximal and distal esophagus                            with cold forceps for histology. Estimated blood                            loss was minimal.                           The entire examined stomach was normal except for                            focal erythema in the pre-pylorus. Biopsies were                            taken from the antrum, body, and fundus with a cold  forceps for histology. Estimated blood loss was                            minimal.                           The examined duodenum was normal.                           The cardia and gastric fundus were normal on                            retroflexion.                           The exam was otherwise without abnormality. Complications:            No immediate complications. Estimated blood loss:                            Minimal. Estimated Blood Loss:     Estimated blood loss was minimal. Impression:               - Normal esophagus. Biopsied.                           - Normal stomach except for focal erythema in the                            pre-pylorus. Biopsied.                           - Normal examined duodenum.                           - The examination was otherwise normal. Recommendation:           - Patient has a contact number available for                            emergencies. The signs and symptoms of potential                            delayed complications were discussed with the                             patient. Return to normal activities tomorrow.                            Written discharge instructions were provided to the                            patient.                           - Resume previous diet.                           -  Continue present medications.                           - Await pathology results. Thornton Park MD, MD 03/10/2020 2:50:44 PM This report has been signed electronically.

## 2020-03-10 NOTE — Patient Instructions (Signed)
[facility] handout given. Await pathology from Dr Tarri Glenn.    YOU HAD AN ENDOSCOPIC PROCEDURE TODAY AT [facility]:   Refer to the procedure report that was given to you for any specific questions about what was found during the examination.  If the procedure report does not answer your questions, please call your gastroenterologist to clarify.  If you requested that your care partner not be given the details of your procedure findings, then the procedure report has been included in a sealed envelope for you to review at your convenience later.  YOU SHOULD EXPECT: Some feelings of bloating in the abdomen. Passage of more gas than usual.  Walking can help get rid of the air that was put into your GI tract during the procedure and reduce the bloating. If you had a lower endoscopy (such as a colonoscopy or flexible sigmoidoscopy) you may notice spotting of blood in your stool or on the toilet paper. If you underwent a bowel prep for your procedure, you may not have a normal bowel movement for a few days.  Please Note:  You might notice some irritation and congestion in your nose or some drainage.  This is from the oxygen used during your procedure.  There is no need for concern and it should clear up in a day or so.  SYMPTOMS TO REPORT IMMEDIATELY:   Following lower endoscopy (colonoscopy or flexible sigmoidoscopy):  Excessive amounts of blood in the stool  Significant tenderness or worsening of abdominal pains  Swelling of the abdomen that is new, acute  Fever of 100F or higher   Following upper endoscopy (EGD)  Vomiting of blood or coffee ground material  New chest pain or pain under the shoulder blades  Painful or persistently difficult swallowing  New shortness of breath  Fever of 100F or higher  Black, tarry-looking stools  For urgent or emergent issues, a gastroenterologist can be reached at any hour by calling [phone]. Do not use MyChart messaging for urgent  concerns.    DIET:  We do recommend a small meal at first, but then you may proceed to your regular diet.  Drink plenty of fluids but you should avoid alcoholic beverages for 24 hours.  ACTIVITY:  You should plan to take it easy for the rest of today and you should NOT DRIVE or use heavy machinery until tomorrow (because of the sedation medicines used during the test).    FOLLOW UP: Our staff will call the number listed on your records 48-72 hours following your procedure to check on you and address any questions or concerns that you may have regarding the information given to you following your procedure. If we do not reach you, we will leave a message.  We will attempt to reach you two times.  During this call, we will ask if you have developed any symptoms of COVID 19. If you develop any symptoms (ie: fever, flu-like symptoms, shortness of breath, cough etc.) before then, please call [phone].  If you test positive for Covid 19 in the 2 weeks post procedure, please call and report this information to [place].    If any biopsies were taken you will be contacted by phone or by letter within the next 1-3 weeks.  Please call us at [phone] if you have not heard about the biopsies in 3 weeks.    SIGNATURES/CONFIDENTIALITY: You and/or your care partner have signed paperwork which will be entered into your electronic medical record.  These  signatures attest to the fact that that the information above on your After Visit Summary has been reviewed and is understood.  Full responsibility of the confidentiality of this discharge information lies with you and/or your care-partner. 

## 2020-03-10 NOTE — Progress Notes (Signed)
Called to room to assist during endoscopic procedure.  Patient ID and intended procedure confirmed with present staff. Received instructions for my participation in the procedure from the performing physician.  

## 2020-03-10 NOTE — Progress Notes (Signed)
Pt's states no medical or surgical changes since previsit or office visit. 

## 2020-03-10 NOTE — Op Note (Signed)
[facility] Patient Name: Scott Sexton Procedure Date: 03/10/2020 2:08 PM MRN: [id number] Endoscopist: Thornton Park MD, MD Age: 49 Referring MD:  Date of Birth: 1970-10-19 Gender: Male Account #: [id number] Procedure:                Colonoscopy Indications:              Screening for colorectal malignant neoplasm, This                            is the patient's first colonoscopy                           No known family history of colon cancer or polyps Medicines:                Monitored Anesthesia Care Procedure:                Pre-Anesthesia Assessment:                           - Prior to the procedure, a History and Physical                            was performed, and patient medications and                            allergies were reviewed. The patient's tolerance of                            previous anesthesia was also reviewed. The risks                            and benefits of the procedure and the sedation                            options and risks were discussed with the patient.                            All questions were answered, and informed consent                            was obtained. Prior Anticoagulants: The patient has                            taken no previous anticoagulant or antiplatelet                            agents. ASA Grade Assessment: II - A patient with                            mild systemic disease. After reviewing the risks                            and benefits, the patient was deemed in  satisfactory condition to undergo the procedure.                           After obtaining informed consent, the colonoscope                            was passed under direct vision. Throughout the                            procedure, the patient's blood pressure, pulse, and                            oxygen saturations were monitored continuously. The                            Colonoscope was  introduced through the anus and                            advanced to the 3 cm into the ileum. The                            colonoscopy was performed without difficulty. The                            patient tolerated the procedure well. The quality                            of the bowel preparation was good. The terminal                            ileum, ileocecal valve, appendiceal orifice, and                            rectum were photographed. Scope In: 2:24:38 PM Scope Out: 2:43:43 PM Scope Withdrawal Time: 0 hours 16 minutes 37 seconds  Total Procedure Duration: 0 hours 19 minutes 5 seconds  Findings:                 The perianal and digital rectal examinations were                            normal.                           A 3 mm polyp was found in the cecum. The polyp was                            flat. The polyp was removed with a cold snare.                            Resection and retrieval were complete. Estimated                            blood loss was minimal.  A 2 mm polyp was found in the ascending colon. The                            polyp was sessile. The polyp was removed with a                            cold snare. Resection and retrieval were complete.                            Estimated blood loss was minimal.                           A 1 mm polyp was found in the descending colon. The                            polyp was sessile. The polyp was removed with a                            cold snare. Resection and retrieval were complete.                            Estimated blood loss was minimal.                           A 5 mm polyp was found in the ascending colon. The                            polyp was flat. The polyp was removed with a cold                            snare in a piecemeal fashion. Resection and                            retrieval were complete. Estimated blood loss was                             minimal. Complications:            No immediate complications. Estimated blood loss:                            Minimal. Estimated Blood Loss:     Estimated blood loss was minimal. Impression:               - One 3 mm polyp in the cecum, removed with a cold                            snare. Resected and retrieved.                           - One 2 mm polyp in the ascending colon, removed  with a cold snare. Resected and retrieved.                           - One 1 mm polyp in the descending colon, removed                            with a cold snare. Resected and retrieved.                           - One 5 mm polyp in the ascending colon, removed                            with a cold snare. Resected and retrieved. Recommendation:           - Patient has a contact number available for                            emergencies. The signs and symptoms of potential                            delayed complications were discussed with the                            patient. Return to normal activities tomorrow.                            Written discharge instructions were provided to the                            patient.                           - Resume previous diet.                           - Continue present medications.                           - Await pathology results.                           - Repeat colonoscopy date to be determined after                            pending pathology results are reviewed for                            surveillance.                           - Emerging evidence supports eating a diet of                            fruits, vegetables, grains, calcium, and yogurt  while reducing red meat and alcohol may reduce the                            risk of colon cancer.                           - Thank you for allowing me to be involved in your                            colon cancer prevention. Thornton Park MD, MD 03/10/2020 3:00:57 PM This report has been signed electronically.

## 2020-03-10 NOTE — Progress Notes (Signed)
PT taken to PACU. Monitors in place. VSS. Report given to RN. 

## 2020-03-12 ENCOUNTER — Telehealth: Payer: Self-pay | Admitting: *Deleted

## 2020-03-12 NOTE — Telephone Encounter (Signed)
Second follow up call made, left message. 

## 2020-03-12 NOTE — Telephone Encounter (Signed)
Attempted f/u phone call. No answer. Left message. °

## 2020-03-17 ENCOUNTER — Other Ambulatory Visit: Payer: Self-pay

## 2020-03-17 MED ORDER — PANTOPRAZOLE SODIUM 40 MG PO TBEC: 40.0000 mg | DELAYED_RELEASE_TABLET | Freq: Two times a day (BID) | ORAL | 2 refills | Status: DC

## 2020-04-22 ENCOUNTER — Ambulatory Visit: Payer: 59 | Admitting: Gastroenterology

## 2020-05-31 ENCOUNTER — Ambulatory Visit (INDEPENDENT_AMBULATORY_CARE_PROVIDER_SITE_OTHER): Payer: 59 | Admitting: Gastroenterology

## 2020-05-31 ENCOUNTER — Other Ambulatory Visit: Payer: Self-pay

## 2020-05-31 ENCOUNTER — Encounter: Payer: Self-pay | Admitting: Gastroenterology

## 2020-05-31 VITALS — BP 120/70 | HR 65 | Ht 68.0 in | Wt 179.0 lb

## 2020-05-31 DIAGNOSIS — K2101 Gastro-esophageal reflux disease with esophagitis, with bleeding: Secondary | ICD-10-CM | POA: Diagnosis not present

## 2020-05-31 MED ORDER — PANTOPRAZOLE SODIUM 40 MG PO TBEC: 40.0000 mg | DELAYED_RELEASE_TABLET | Freq: Two times a day (BID) | ORAL | 11 refills | Status: DC

## 2020-05-31 NOTE — Progress Notes (Signed)
Referring Provider: Colon Branch, MD Primary Care Physician:  Colon Branch, MD  Chief complaint:  Reflux   IMPRESSION:  Biopsy-proven reflux improved on pantoprazole 40 mg BID History of H pylori gastritis 2009 History of colon polyps    - TA x 1, SSP x 3 on colonoscopy 03/2020 Family history of colon polyps (father)  Reflux: Symptoms controlled on pantoprazole 40 mg twice daily.  Reviewed lifestyle modifications.  Consider PPI dose reduction if symptoms remain stable over time.  I recommended at least annual follow-up, earlier if needed.  Colon polyps: Reviewed pathology results from recent colonoscopy. Surveillance due in 2024.  Colonoscopy recommended for colon cancer screening.   PLAN: Continue pantoprazole to 40 mg BID, consider dose reduction over time Reviewed GERD lifestyle modications Surveillance colonoscopy 2024 Annual follow-up, earlier as needed  HPI: Scott Sexton is a 50 y.o. male returns in follow-up for reflux. He has a history of H pylori gastritis on EGD with Dr. Deatra Sexton in 2009 that was treated with Prevpac. Remained asymptomatic until two years ago. Resume Nexium 40mg  with incomplete control of his reflux.   EGD 03/10/20 revealed esophagitis and gastric mucosal changes of proton pump inhibitors. Biopsies were negative for H pylori.  He returns today in scheduled follow-up. Symptoms have largely resolved on pantoprazole increased to BID dosing schedule, although he often forgets to take the evening dose. No new complaints or concerns today.   Normal labs including CBC, CMP, TSH Abdominal ultrasound 12/03/19 showed possible fatty liver, was otherwise normal  Endoscopic history: EGD with Dr. Deatra Sexton 2009: H pylori gastritis treated with Prevpac EGD 03/10/20: Esophageal biopsies show reflux. No EOE. Normal gastric mucosa except for features of proton pump inhibitor. Colonoscopy 03/10/20: Tubular adenoma, SSP x 3. Surveillance recommended in 3 years.     Past  Medical History:  Diagnosis Date  . Gastritis    EGD 08/2007, H.Pylori positive, status post treatment  . GERD (gastroesophageal reflux disease)     Past Surgical History:  Procedure Laterality Date  . TONSILLECTOMY      Current Outpatient Medications  Medication Sig Dispense Refill  . cyclobenzaprine (FLEXERIL) 10 MG tablet Take 1 tablet (10 mg total) by mouth 3 (three) times daily as needed for muscle spasms. 60 tablet 1  . diclofenac (VOLTAREN) 75 MG EC tablet Take 1 tablet (75 mg total) by mouth 2 (two) times daily as needed for mild pain. 60 tablet 1  . esomeprazole (NEXIUM) 40 MG capsule Take 1 capsule (40 mg total) by mouth in the morning. 60 capsule 3  . gabapentin (NEURONTIN) 300 MG capsule Take 300 mg by mouth at bedtime as needed.    . Multiple Vitamin (MULTIVITAMIN) tablet Take 1 tablet by mouth daily.    . pantoprazole (PROTONIX) 40 MG tablet Take 1 tablet (40 mg total) by mouth 2 (two) times daily before a meal. 60 tablet 2  . predniSONE (STERAPRED UNI-PAK 21 TAB) 5 MG (21) TBPK tablet Take by mouth as directed.    . sildenafil (REVATIO) 20 MG tablet TAKE 3-4 TABLETS AT BEDTIME AS NEEDED. (Patient not taking: Reported on 03/10/2020) 30 tablet 1   No current facility-administered medications for this visit.    Allergies as of 05/31/2020  . (No Known Allergies)    Family History  Problem Relation Age of Onset  . Diabetes Mother   . Hypertension Mother   . Coronary artery disease Mother        onset in her 54s, had an  angioplasty in 47  . Stroke Mother        ?  . Coronary artery disease Other        uncle  . Colon cancer Neg Hx   . Prostate cancer Neg Hx     Social History   Socioeconomic History  . Marital status: Married    Spouse name: Not on file  . Number of children: 1  . Years of education: Not on file  . Highest education level: Not on file  Occupational History  . Occupation: Art gallery manager @ [employer]   Tobacco Use  . Smoking status: Never  Smoker  . Smokeless tobacco: Never Used  Vaping Use  . Vaping Use: Never used  Substance and Sexual Activity  . Alcohol use: Yes    Comment: socially  . Drug use: No  . Sexual activity: Not on file  Other Topics Concern  . Not on file  Social History Narrative   Originally from [place],    Married, 1 son, boy, 2005       Social Determinants of Health   Financial Resource Strain: Not on [employer] Insecurity: Not on file  Transportation Needs: Not on file  Physical Activity: Not on file  Stress: Not on file  Social Connections: Not on file  Intimate Partner Violence: Not on file    Physical Exam: General:   Alert,  well-nourished, pleasant and cooperative in NAD Head:  Normocephalic and atraumatic. Eyes:  Sclera clear, no icterus.   Conjunctiva pink. Abdomen:  Soft, nontender, nondistended, normal bowel sounds, no rebound or guarding. No hepatosplenomegaly.   LAD: No inguinal or umbilical LAD Extremities:  No clubbing or edema. Neurologic:  Alert and  oriented x4;  grossly nonfocal Skin:  Intact without significant lesions or rashes. Psych:  Alert and cooperative. Normal mood and affect.    [name], MD, MPH 05/31/2020, 11:21 AM

## 2020-05-31 NOTE — Patient Instructions (Addendum)
I am glad that you are feeling better.  PRESCRIPTION MEDICATION(S):   We have sent the following medication(s) to your pharmacy:  . Pantoprazole - please take 40mg  by mouth twice daily. We have sent refills for 1 year  . NOTE: If your medication(s) requires a PRIOR AUTHORIZATION, we will receive notification from your pharmacy. Once received, the process to submit for approval may take up to 7-10 business days. You will be contacted about any denials we have received from your insurance company as well as alternatives recommended by your provider.  RECOMMENDATION(S):  - If you symptoms remain well controlled, you consider a slow taper of the pantoprazole. But, please don't stop it suddenly.   Given your history of colon polyps I recommend a surveillance colonoscopy in 2024.   FOLLOW UP:  I would like to see you at least annually. There is the potential for side effects with long-term use of pantoprazole including kidney disease, decrease in bone mineral density, diarrhea, and even dementia. I would like to see you at least annually to review the need for treatment and to determine if any safety monitoring should be performed.   However, please let me know if you need anything prior to that time.   BMI:  If you are age 20 or younger, your body mass index should be between 19-25. Your There is no height or weight on file to calculate BMI. If this is out of the aformentioned range listed, please consider follow up with your Primary Care Provider.   Thank you for trusting me with your gastrointestinal care!    Thornton Park, MD, MPH

## 2020-06-18 ENCOUNTER — Other Ambulatory Visit: Payer: Self-pay | Admitting: Gastroenterology

## 2020-06-18 DIAGNOSIS — K2101 Gastro-esophageal reflux disease with esophagitis, with bleeding: Secondary | ICD-10-CM

## 2020-07-19 ENCOUNTER — Encounter: Payer: Self-pay | Admitting: Internal Medicine

## 2020-09-28 ENCOUNTER — Telehealth: Payer: Self-pay | Admitting: Internal Medicine

## 2020-09-28 ENCOUNTER — Other Ambulatory Visit: Payer: Self-pay | Admitting: Internal Medicine

## 2020-09-28 DIAGNOSIS — M545 Low back pain, unspecified: Secondary | ICD-10-CM

## 2020-09-28 MED ORDER — DICLOFENAC SODIUM 75 MG PO TBEC: 75.0000 mg | DELAYED_RELEASE_TABLET | Freq: Two times a day (BID) | ORAL | 1 refills | Status: DC | PRN

## 2020-09-28 NOTE — Telephone Encounter (Signed)
appt schedule with pt

## 2020-09-28 NOTE — Telephone Encounter (Signed)
Medication: diclofenac (VOLTAREN) 75 MG EC tablet [[id number]]       Has the patient contacted their pharmacy? yes (If no, request that the patient contact the pharmacy for the refill.) (If yes, when and what did the pharmacy advise?)   call your doc  Preferred Pharmacy (with phone number or street name): [facility], [place] - [address]  [place], [place] [address]  Phone:  [phone] Fax:  [phone]      Agent: Please be advised that RX refills may take up to 3 business days. We ask that you follow-up with your pharmacy.

## 2020-09-28 NOTE — Telephone Encounter (Signed)
Rx sent. Pt is due for cpe 11/2020- please schedule at his earliest convenience.

## 2020-10-26 ENCOUNTER — Other Ambulatory Visit: Payer: Self-pay | Admitting: Internal Medicine

## 2020-11-22 ENCOUNTER — Other Ambulatory Visit: Payer: Self-pay | Admitting: Internal Medicine

## 2020-11-22 DIAGNOSIS — M545 Low back pain, unspecified: Secondary | ICD-10-CM

## 2020-12-15 ENCOUNTER — Other Ambulatory Visit: Payer: Self-pay

## 2020-12-15 ENCOUNTER — Encounter: Payer: Self-pay | Admitting: Internal Medicine

## 2020-12-15 ENCOUNTER — Ambulatory Visit (INDEPENDENT_AMBULATORY_CARE_PROVIDER_SITE_OTHER): Payer: 59 | Admitting: Internal Medicine

## 2020-12-15 VITALS — BP 142/80 | HR 69 | Temp 98.2°F | Resp 16 | Ht 68.0 in | Wt 177.1 lb

## 2020-12-15 DIAGNOSIS — Z1159 Encounter for screening for other viral diseases: Secondary | ICD-10-CM

## 2020-12-15 DIAGNOSIS — Z0000 Encounter for general adult medical examination without abnormal findings: Secondary | ICD-10-CM

## 2020-12-15 DIAGNOSIS — R739 Hyperglycemia, unspecified: Secondary | ICD-10-CM

## 2020-12-15 NOTE — Progress Notes (Signed)
Subjective:    Patient ID: Scott Sexton, male    DOB: 06-14-1970, 50 y.o.   MRN: [id number]  DOS:  12/15/2020 Type of visit - description: CPX Since the last office visit he is doing well. He did have a colonoscopy November 2021. Interestingly today he saw some red blood per rectum during the bowel movement.  He saw some blood in the toilet paper and some in the water.  About 3 teaspoons?. No rectal pain but some rectal itching.    BP Readings from Last 3 Encounters:  12/15/20 (!) 142/80  05/31/20 120/70  03/10/20 114/69   Wt Readings from Last 3 Encounters:  12/15/20 177 lb 2 oz (80.3 kg)  05/31/20 179 lb (81.2 kg)  03/10/20 177 lb (80.3 kg)   Review of Systems  Other than above, a 14 point review of systems is negative       Past Medical History:  Diagnosis Date   Gastritis    EGD 08/2007, H.Pylori positive, status post treatment   GERD (gastroesophageal reflux disease)     Past Surgical History:  Procedure Laterality Date   TONSILLECTOMY     Social History   Socioeconomic History   Marital status: Married    Spouse name: Not on file   Number of children: 1   Years of education: Not on file   Highest education level: Not on file  Occupational History   Occupation: Art gallery manager @ [employer]   Tobacco Use   Smoking status: Never   Smokeless tobacco: Never  Vaping Use   Vaping Use: Never used  Substance and Sexual Activity   Alcohol use: Yes    Comment: socially   Drug use: No   Sexual activity: Not on file  Other Topics Concern   Not on file  Social History Narrative   Originally from [place],    Married, 1 son, boy, 2005       Social Determinants of Health   Financial Resource Strain: Not on file  Food Insecurity: Not on file  Transportation Needs: Not on file  Physical Activity: Not on file  Stress: Not on file  Social Connections: Not on file  Intimate Partner Violence: Not on file    Allergies as of 12/15/2020   No Known Allergies       Medication List        Accurate as of December 15, 2020 11:59 PM. If you have any questions, ask your nurse or doctor.          STOP taking these medications    diclofenac 75 MG EC tablet Commonly known as: VOLTAREN Stopped by: Kathlene November, MD       TAKE these medications    cyclobenzaprine 10 MG tablet Commonly known as: FLEXERIL Take 1 tablet (10 mg total) by mouth 3 (three) times daily as needed for muscle spasms.   gabapentin 300 MG capsule Commonly known as: NEURONTIN Take 300 mg by mouth at bedtime as needed.   pantoprazole 40 MG tablet Commonly known as: PROTONIX TAKE 1 TABLET(40 MG) BY MOUTH TWICE DAILY BEFORE A MEAL   sildenafil 20 MG tablet Commonly known as: REVATIO TAKE 3-4 TABLETS AT BEDTIME AS NEEDED.           Objective:   Physical Exam BP (!) 142/80 (BP Location: Left Arm, Patient Position: Sitting, Cuff Size: Small)   Pulse 69   Temp 98.2 F (36.8 C) (Oral)   Resp 16   Ht '5\' 8"'$  (1.727 m)  Wt 177 lb 2 oz (80.3 kg)   SpO2 98%   BMI 26.93 kg/m  General: Well developed, NAD, BMI noted Neck: No  thyromegaly  HEENT:  Normocephalic . Face symmetric, atraumatic Lungs:  CTA B Normal respiratory effort, no intercostal retractions, no accessory muscle use. Heart: RRR,  no murmur.  Abdomen:  Not distended, soft, non-tender. No rebound or rigidity.   Lower extremities: no pretibial edema bilaterally  Skin: Exposed areas without rash. Not pale. Not jaundice Neurologic:  alert & oriented X3.  Speech normal, gait appropriate for age and unassisted Strength symmetric and appropriate for age.  Psych: Cognition and judgment appear intact.  Cooperative with normal attention span and concentration.  Behavior appropriate. No anxious or depressed appearing.     Assessment       Assessment Hyperglycemia: A1c 6.0 (2019). H. pylori + gastritis per EGD 2009, S/P Rx GERD Varicocele. Re-eaval by urology 2016, [place] confirmed dx,  L>R  PLAN: Here for CPX Hyperglycemia: Recheck A1c, diet and exercise emphasized Red blood per rectum: As described above, had a colonoscopy few months ago, no hemorrhoids noted then.  Per history sounds like benign distal condition, recommend to reach out to GI if he has more bleeding. RTC 1 year    This visit occurred during the SARS-CoV-2 public health emergency.  Safety protocols were in place, including screening questions prior to the visit, additional usage of staff PPE, and extensive cleaning of exam room while observing appropriate contact time as indicated for disinfecting solutions.

## 2020-12-15 NOTE — Patient Instructions (Signed)
Consider a flu shot this fall  Consider getting a tetanus shot booster   [place], [place] back for blood work, fasting within the next few days  Come back for physical exam in 1 year

## 2020-12-16 ENCOUNTER — Encounter: Payer: Self-pay | Admitting: Internal Medicine

## 2020-12-16 NOTE — Assessment & Plan Note (Signed)
Here for CPX Hyperglycemia: Recheck A1c, diet and exercise emphasized Red blood per rectum: As described above, had a colonoscopy few months ago, no hemorrhoids noted then.  Per history sounds like benign distal condition, recommend to reach out to GI if he has more bleeding. RTC 1 year

## 2020-12-16 NOTE — Assessment & Plan Note (Signed)
Td  07-2010, declined a booster S/p covid vaccines x3  rec flu shot q year, he is hesitant + FH CAD: Asymptomatic, has been reluctant to proceed with the statins before Labs: Will come back fasting CMP, FLP, CBC, A1c, hep C Diet exercise: Walks daily with his dog, recommend to walk at a brisk pace, watch diet.

## 2020-12-17 ENCOUNTER — Other Ambulatory Visit: Payer: 59

## 2021-06-14 ENCOUNTER — Encounter: Payer: Self-pay | Admitting: Internal Medicine

## 2021-07-04 ENCOUNTER — Other Ambulatory Visit: Payer: Self-pay | Admitting: Gastroenterology

## 2021-07-04 DIAGNOSIS — K2101 Gastro-esophageal reflux disease with esophagitis, with bleeding: Secondary | ICD-10-CM

## 2021-08-07 ENCOUNTER — Other Ambulatory Visit: Payer: Self-pay | Admitting: Gastroenterology

## 2021-08-07 DIAGNOSIS — K2101 Gastro-esophageal reflux disease with esophagitis, with bleeding: Secondary | ICD-10-CM

## 2021-09-09 ENCOUNTER — Other Ambulatory Visit: Payer: Self-pay | Admitting: Gastroenterology

## 2021-09-09 DIAGNOSIS — K2101 Gastro-esophageal reflux disease with esophagitis, with bleeding: Secondary | ICD-10-CM

## 2021-10-17 ENCOUNTER — Other Ambulatory Visit: Payer: Self-pay

## 2021-10-17 ENCOUNTER — Telehealth: Payer: Self-pay

## 2021-10-17 MED ORDER — GABAPENTIN 300 MG PO CAPS: 300.0000 mg | ORAL_CAPSULE | Freq: Every evening | ORAL | 0 refills | Status: DC | PRN

## 2021-10-17 NOTE — Telephone Encounter (Signed)
Pt requesting increase in sildenafil '20mg'$ . Does he need appt for this?

## 2021-10-18 MED ORDER — SILDENAFIL CITRATE 20 MG PO TABS: 80.0000 mg | ORAL_TABLET | Freq: Every evening | ORAL | 3 refills | Status: DC | PRN

## 2021-10-18 NOTE — Telephone Encounter (Signed)
Previous Rx was sildenafil 3-4 tab qhs prn, ok to increase to 4-5 tabs qhs prn, send #30, 3 RF

## 2021-10-18 NOTE — Telephone Encounter (Signed)
LMOM informing Pt of recommendations. Rx sent to [facility].

## 2021-10-24 ENCOUNTER — Other Ambulatory Visit: Payer: Self-pay | Admitting: Family Medicine

## 2021-10-24 ENCOUNTER — Telehealth: Payer: Self-pay | Admitting: Internal Medicine

## 2021-10-24 DIAGNOSIS — M545 Low back pain, unspecified: Secondary | ICD-10-CM

## 2021-10-24 NOTE — Telephone Encounter (Signed)
Pt declined an appt. States he needs it to travel.   Medication: cyclobenzaprine (FLEXERIL) 10 MG tablet   Has the patient contacted their pharmacy? Yes.    Preferred Pharmacy: [facility] [place], [place] - [address]   [place], [place] [address]  Phone:  [phone]  Fax:  [phone]

## 2021-10-25 MED ORDER — CYCLOBENZAPRINE HCL 10 MG PO TABS: 10.0000 mg | ORAL_TABLET | Freq: Three times a day (TID) | ORAL | 1 refills | Status: DC | PRN

## 2021-10-25 NOTE — Telephone Encounter (Signed)
Left message machine that rx has been sent in.

## 2022-01-06 ENCOUNTER — Other Ambulatory Visit: Payer: Self-pay | Admitting: Internal Medicine

## 2022-01-25 NOTE — Progress Notes (Signed)
01/30/2022 Scott Sexton [id number] 03/05/1971  Referring provider: Colon Branch, MD Primary GI doctor: Dr. Tarri Glenn  ASSESSMENT AND PLAN:   Assessment: 51 y.o. male here for assessment of the following: 1. Gastroesophageal reflux disease with esophagitis and hemorrhage   2. Benign neoplasm of cecum    11/2019 AB [place] for GERD fatty liver, GB unremarkable. 03/2020 colonoscopy  TA x 1, SSP x 3 due 03/2023 03/10/20 EGD revealed esophagitis and gastric mucosal changes. Biopsies were negative for H pylori. Some concern for taking the PPI long term, symptoms improve on twice a day. Feels nexium better than protonix.   Plan: Check labs with PCP for Vitamin D/absorption issues with PPI but otherwise patient reassuring.  Increase nexium to twice a day  Continue with antireflux measures and lifestyle modifications, continue on PPI emphasizing timing prior to meals Patient is interested in surgical options for GERD. Discussed TIF procedure with patient, given information.  May need repeat EGD and or Ph study/esophageal manometry prior to TIF, will discuss further at 6 week OV  Meds ordered this encounter  Medications   esomeprazole (NEXIUM) 40 MG capsule    Sig: Take 1 capsule (40 mg total) by mouth 2 (two) times daily before a meal.    Dispense:  180 capsule    Refill:  0    History of Present Illness:  51 y.o. male  with a past medical history of H. pylori gastritis treated 2009, GERD personal history of colon polyps and others listed below, returns to clinic today for evaluation of GERD/globulus sensation.  11/2019 AB [place] for GERD fatty liver, GB unremarkable. 03/2020 colonoscopy  TA x 1, SSP x 3 due 03/2023 03/10/20 EGD revealed esophagitis and gastric mucosal changes. Biopsies were negative for H pylori. Given PPI BID.  He states he has been on PPI x 20 years and he states this makes him anxious.  He states he still has GERD was on pantoprazole once daily 40 mg, switched 2-3  weeks ago on Nexium 40 mg, will wake up in the morning with GERD.  He states he was drinking energy drinks but stopped.  He has substernal burning, worse with acidic or spicy foods. No radiation into back, no true AB pain.  Denies dysphagia, odynophagia. If he has bad reflux will feel into his throat.  He clears his throat in the morning only, has had worsening allergies.  ETOH twice a week, one drink.  No NSAIDS.   Has BM once a day, no melena, no hematochezia.   He  reports that he has never smoked. He has never used smokeless tobacco. He reports current alcohol use. He reports that he does not use drugs. His family history includes Coronary artery disease in his mother and another family member; Diabetes in his mother; Hypertension in his mother; Stroke in his mother.   Current Medications:    Current Outpatient Medications (Cardiovascular):    sildenafil (REVATIO) 20 MG tablet, TAKE 3-4 TABLETS AT BEDTIME AS NEEDED.     Current Outpatient Medications (Other):    cyclobenzaprine (FLEXERIL) 10 MG tablet, Take 1 tablet (10 mg total) by mouth 3 (three) times daily as needed for muscle spasms.   gabapentin (NEURONTIN) 300 MG capsule, Take 1 capsule (300 mg total) by mouth at bedtime as needed.   esomeprazole (NEXIUM) 40 MG capsule, Take 1 capsule (40 mg total) by mouth 2 (two) times daily before a meal.  Surgical History:  He  has a past surgical history  that includes Tonsillectomy.  Current Medications, Allergies, Past Medical History, Past Surgical History, Family History and Social History were reviewed in [employer] record.  Physical Exam: BP 102/70   Pulse 68   Ht '5\' 8"'$  (1.727 m)   Wt 185 lb (83.9 kg)   BMI 28.13 kg/m  General:   Pleasant, well developed male in no acute distress Heart : Regular rate and rhythm; no murmurs Pulm: Clear anteriorly; no wheezing Abdomen:  Soft, Flat AB, Active bowel sounds. No tenderness . , No organomegaly  appreciated. Rectal: Not evaluated Extremities:  without  edema. Neurologic:  Alert and  oriented x4;  No focal deficits.  Psych:  Cooperative. Normal mood and affect.   Vladimir Crofts, PA-C 01/30/22

## 2022-01-30 ENCOUNTER — Encounter: Payer: Self-pay | Admitting: Physician Assistant

## 2022-01-30 ENCOUNTER — Telehealth: Payer: Self-pay

## 2022-01-30 ENCOUNTER — Other Ambulatory Visit (HOSPITAL_COMMUNITY): Payer: Self-pay

## 2022-01-30 ENCOUNTER — Ambulatory Visit (INDEPENDENT_AMBULATORY_CARE_PROVIDER_SITE_OTHER): Payer: 59 | Admitting: Physician Assistant

## 2022-01-30 VITALS — BP 102/70 | HR 68 | Ht 68.0 in | Wt 185.0 lb

## 2022-01-30 DIAGNOSIS — K2101 Gastro-esophageal reflux disease with esophagitis, with bleeding: Secondary | ICD-10-CM | POA: Diagnosis not present

## 2022-01-30 DIAGNOSIS — D120 Benign neoplasm of cecum: Secondary | ICD-10-CM | POA: Diagnosis not present

## 2022-01-30 MED ORDER — ESOMEPRAZOLE MAGNESIUM 40 MG PO CPDR: 40.0000 mg | DELAYED_RELEASE_CAPSULE | Freq: Two times a day (BID) | ORAL | 0 refills | Status: DC

## 2022-01-30 NOTE — Telephone Encounter (Signed)
Patient Advocate Encounter  Prior Authorization for Esomeprazole Magnesium '40MG'$  dr capsules has been approved.     Effective: 01-30-2022 to 01-31-2023

## 2022-01-30 NOTE — Telephone Encounter (Signed)
Patient Advocate Encounter   Received notification that prior authorization is required for Esomeprazole Magnesium '40MG'$  dr capsules  Submitted: 01-30-2022 Key [id number]  Status is pending

## 2022-01-30 NOTE — Patient Instructions (Addendum)
Follow up in 6-8 weeks with Estill Bamberg or Dr.Beavers.  Due for colonoscopy 03/2023  Please take your proton pump inhibitor medication, nexium twice a day.   Please take this medication 30 minutes to 1 hour before meals- this makes it more effective.  Avoid spicy and acidic foods Avoid fatty foods Limit your intake of coffee, tea, alcohol, and carbonated drinks Work to maintain a healthy weight Keep the head of the bed elevated at least 3 inches with blocks or a wedge pillow if you are having any nighttime symptoms Stay upright for 2 hours after eating Avoid meals and snacks three to four hours before bedtime  For more information on the TIF procedure please visit the website at [email or web address].[email or web address]. If you would like to view an informative video regarding the TIF procedure, please visit:  [email or web address].  It was a pleasure to see you today!  Thank you for trusting me with your gastrointestinal care!

## 2022-01-30 NOTE — Progress Notes (Signed)
Reviewed and agree with management plans. Could consider trial of famotidine 20 mg BID instead of PPI therapy given concerns for long term risks.   Nashaly Dorantes L. Tarri Glenn, MD, MPH

## 2022-04-06 ENCOUNTER — Ambulatory Visit: Payer: 59 | Admitting: Gastroenterology

## 2022-05-17 IMAGING — US US ABDOMEN COMPLETE
1 series · 14 of 25 positions shown · non-contrast
Comparison: None

CLINICAL DATA: RIGHT upper quadrant and epigastric abdominal pain

EXAM:
ABDOMEN ULTRASOUND COMPLETE

[Series 1: us abdomen complete · 14 of 63 slices shown]
[im 1/63]
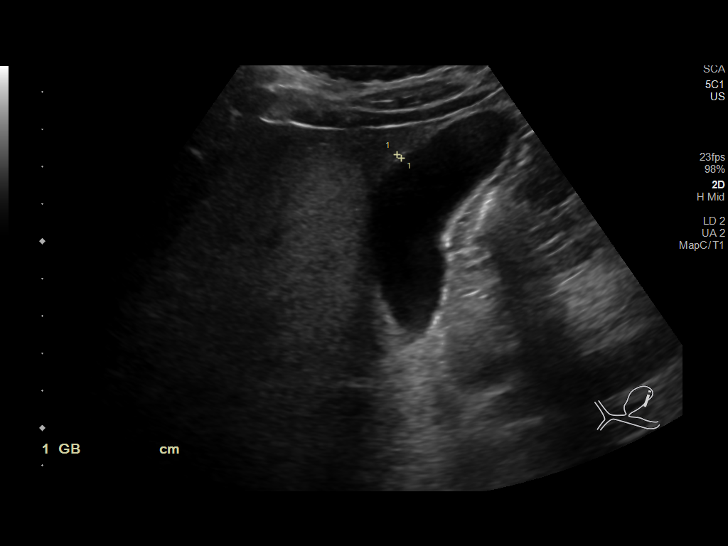
[im 6/63]
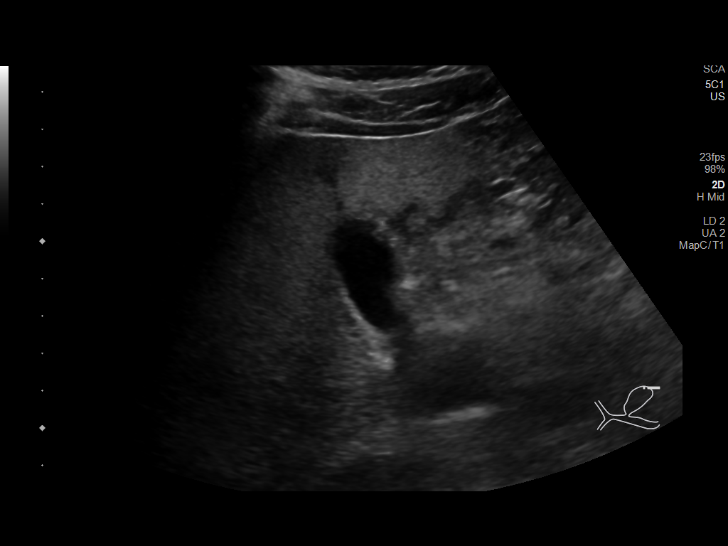
[im 11/63]
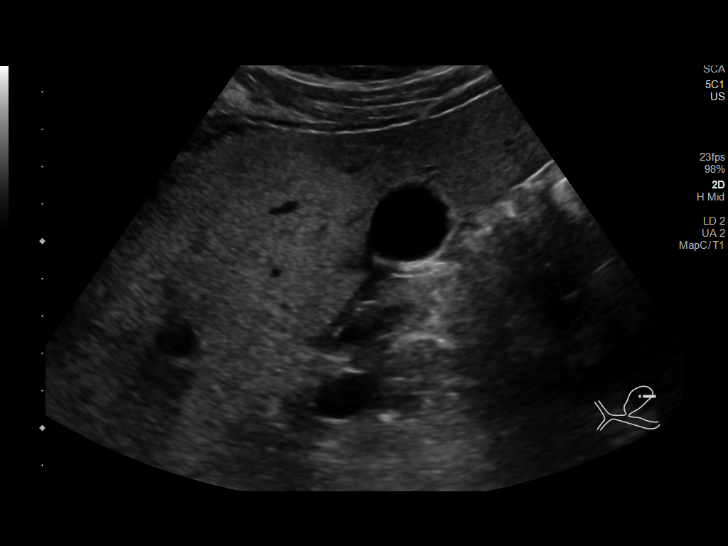
[im 16/63]
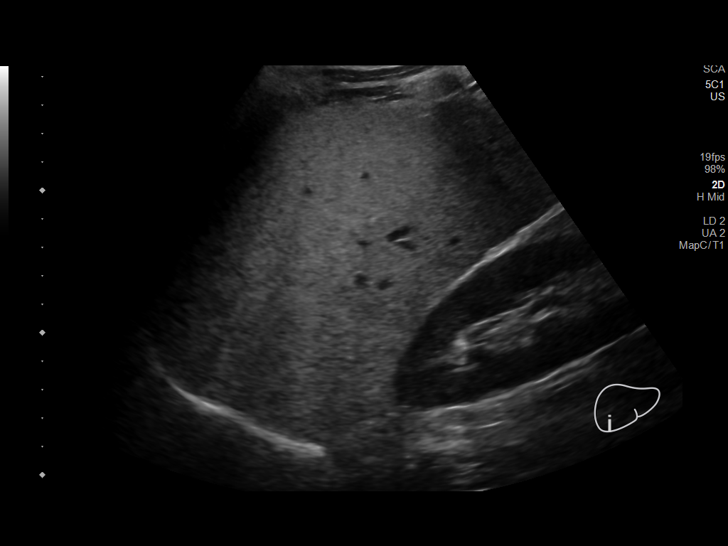
[im 21/63]
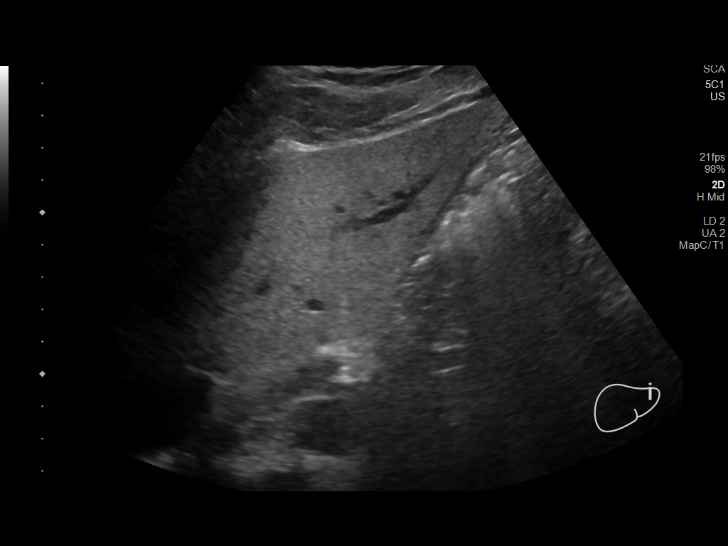
[im 24/63]
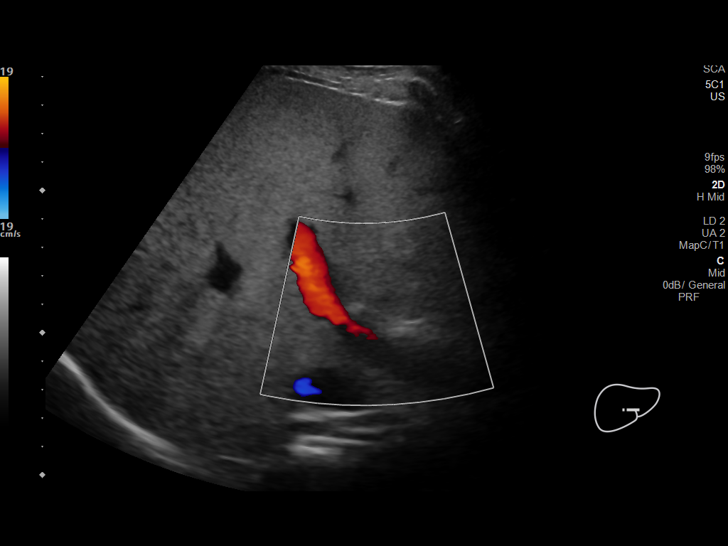
[im 29/63]
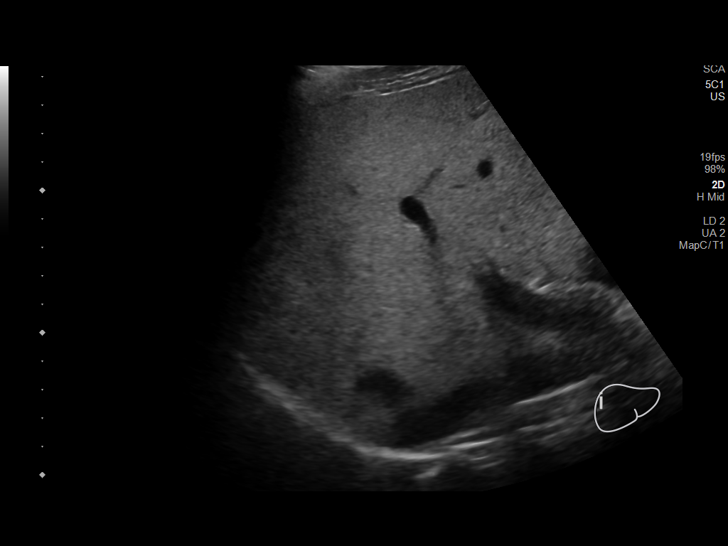
[im 34/63]
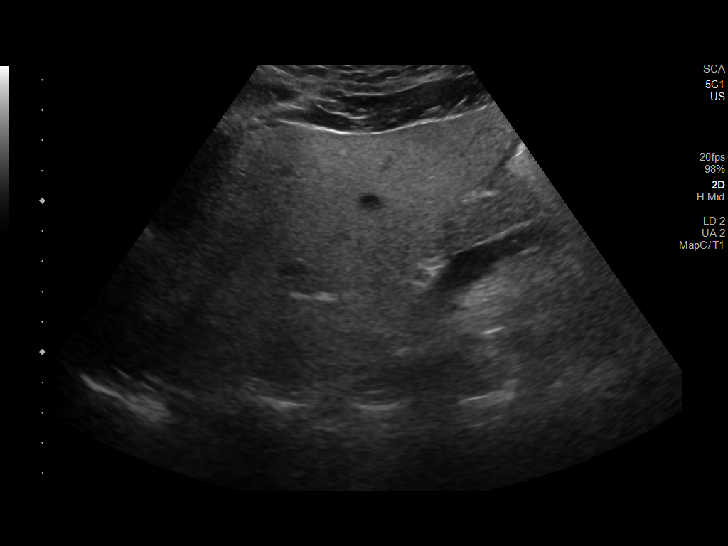
[im 39/63]
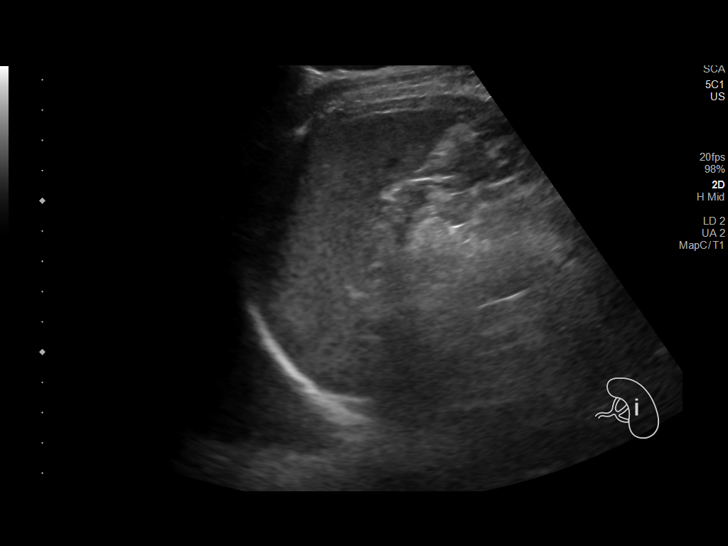
[im 42/63]
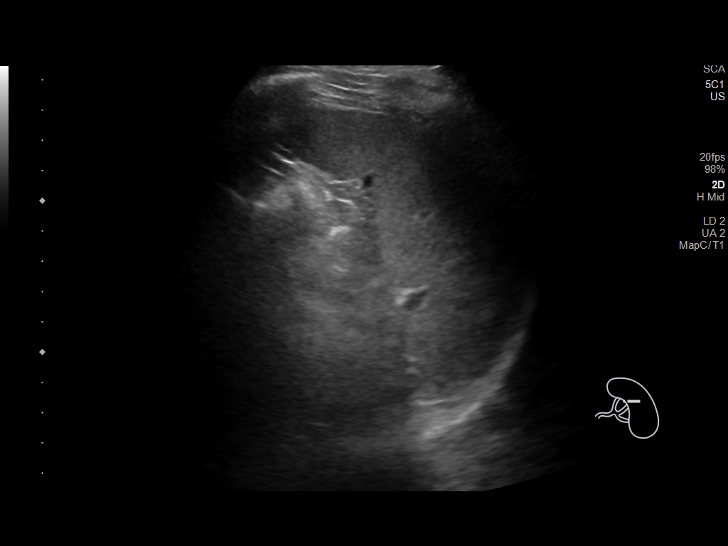
[im 47/63]
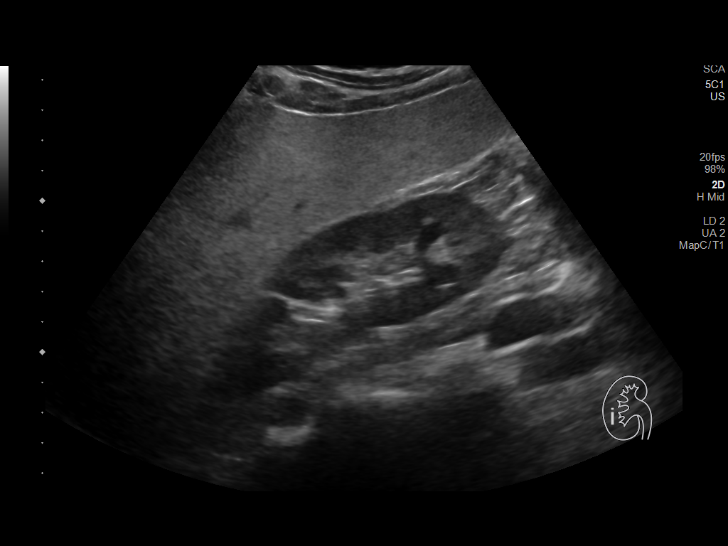
[im 52/63]
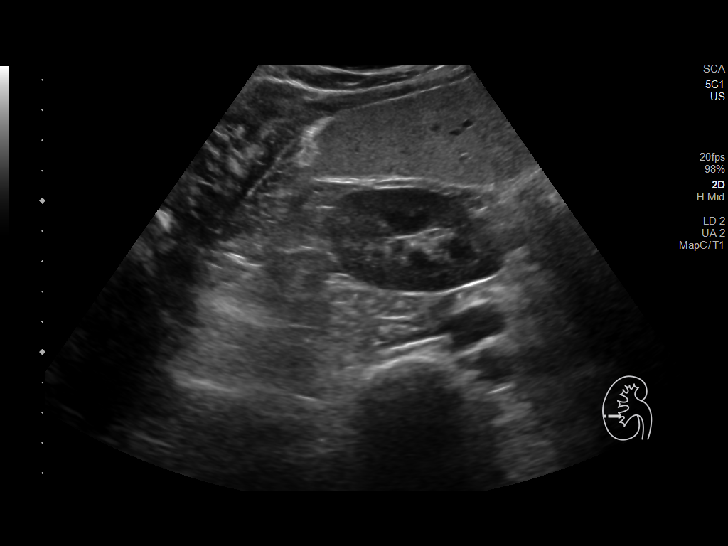
[im 57/63]
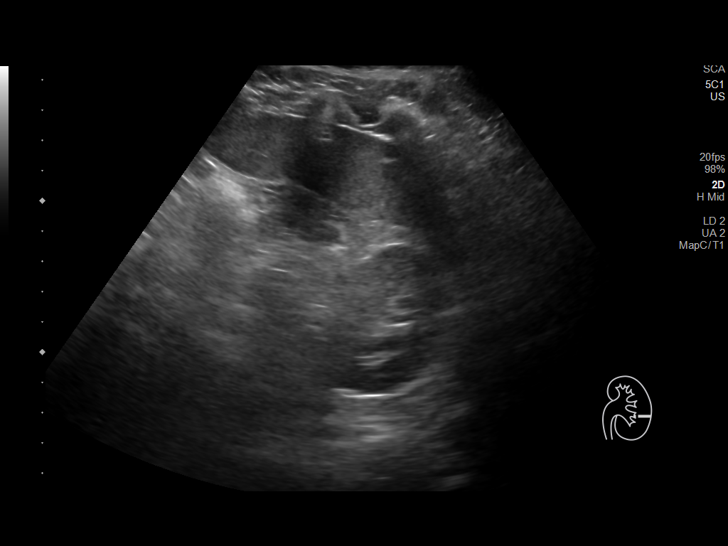
[im 63/63]
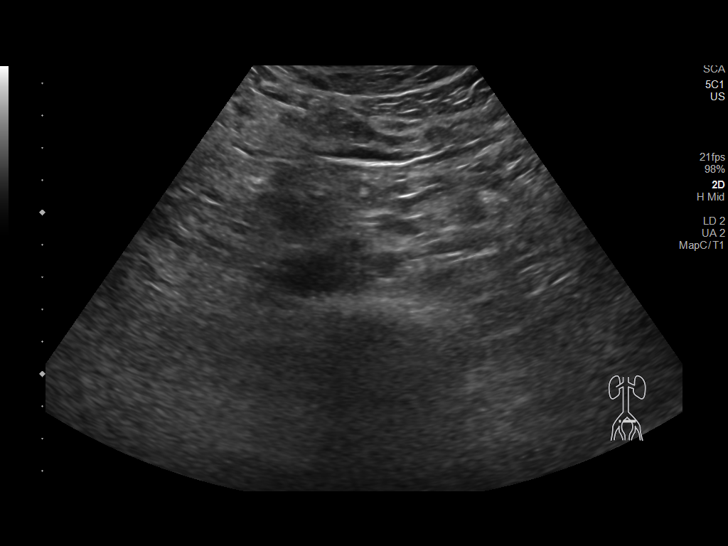

[14 of 25 positions shown; findings below may reference images not displayed]

FINDINGS: Gallbladder: Normally distended without stones or wall thickening.
No pericholecystic fluid or sonographic Murphy sign.

Common bile duct: Diameter: 3 mm, normal

Liver: Echogenic parenchyma, likely fatty infiltration though this
can be seen with cirrhosis and certain infiltrative disorders.
Minimal focal sparing adjacent to gallbladder fossa. No additional
hepatic mass. Portal vein is patent on color Doppler imaging with
normal direction of blood flow towards the liver.

IVC: Normal appearance

Pancreas: Normal appearance

Spleen: Normal appearance, 7.2 cm length

Right Kidney: Length: 12.4 cm. Normal morphology without mass or
hydronephrosis.

Left Kidney: Length: 11.8 cm. Normal morphology without mass or
hydronephrosis.

Abdominal aorta: Normal caliber

Other findings: No free fluid
IMPRESSION: Probable fatty infiltration of liver as above with areas of focal
sparing.

Otherwise negative exam.

## 2022-08-08 ENCOUNTER — Ambulatory Visit (INDEPENDENT_AMBULATORY_CARE_PROVIDER_SITE_OTHER): Payer: 59 | Admitting: Internal Medicine

## 2022-08-08 ENCOUNTER — Ambulatory Visit: Payer: 59 | Admitting: Internal Medicine

## 2022-08-08 VITALS — BP 112/67 | HR 71 | Temp 97.8°F | Resp 16 | Wt 177.0 lb

## 2022-08-08 DIAGNOSIS — M545 Low back pain, unspecified: Secondary | ICD-10-CM | POA: Diagnosis not present

## 2022-08-08 DIAGNOSIS — R739 Hyperglycemia, unspecified: Secondary | ICD-10-CM | POA: Diagnosis not present

## 2022-08-08 DIAGNOSIS — N529 Male erectile dysfunction, unspecified: Secondary | ICD-10-CM | POA: Diagnosis not present

## 2022-08-08 MED ORDER — SILDENAFIL CITRATE 20 MG PO TABS: ORAL_TABLET | ORAL | 0 refills | Status: DC

## 2022-08-08 MED ORDER — CYCLOBENZAPRINE HCL 10 MG PO TABS: 10.0000 mg | ORAL_TABLET | Freq: Every day | ORAL | 1 refills | Status: AC | PRN

## 2022-08-08 MED ORDER — ESOMEPRAZOLE MAGNESIUM 40 MG PO CPDR: 40.0000 mg | DELAYED_RELEASE_CAPSULE | Freq: Two times a day (BID) | ORAL | 0 refills | Status: DC

## 2022-08-08 MED ORDER — DICLOFENAC SODIUM 75 MG PO TBEC: 75.0000 mg | DELAYED_RELEASE_TABLET | Freq: Two times a day (BID) | ORAL | 0 refills | Status: DC | PRN

## 2022-08-08 NOTE — Progress Notes (Unsigned)
   Subjective:    Patient ID: Scott Sexton, male    DOB: 06/08/1970, 52 y.o.   MRN: [id number]  DOS:  08/08/2022 Type of visit - description: Acute, needs prescriptions.  Last office visit was 2022. Doing okay since then. Just need refill on medications.  Denies chest pain or difficulty breathing No nausea vomiting.  No diarrhea or blood in the stools.  History of GERD, saw GI, note reviewed. Good compliance with PPIs, symptoms improved significantly in the last 3 months when he started to eat healthier and lost some weight.    Review of Systems See above   Past Medical History:  Diagnosis Date   Gastritis    EGD 08/2007, H.Pylori positive, status post treatment   GERD (gastroesophageal reflux disease)     Past Surgical History:  Procedure Laterality Date   TONSILLECTOMY      Current Outpatient Medications  Medication Instructions   cyclobenzaprine (FLEXERIL) 10 mg, Oral, Daily PRN   diclofenac (VOLTAREN) 75 mg, Oral, 2 times daily PRN   esomeprazole (NEXIUM) 40 mg, Oral, 2 times daily before meals   gabapentin (NEURONTIN) 300 mg, Oral, At bedtime PRN   sildenafil (REVATIO) 20 MG tablet TAKE 3-4 TABLETS AT BEDTIME AS NEEDED.       Objective:   Physical Exam BP 112/67 (BP Location: Right Arm, Patient Position: Sitting, Cuff Size: Small)   Pulse 71   Temp 97.8 F (36.6 C) (Oral)   Resp 16   Wt 177 lb (80.3 kg)   SpO2 99%   BMI 26.91 kg/m  General:   Well developed, NAD, BMI noted. HEENT:  Normocephalic . Face symmetric, atraumatic Lungs:  CTA B Normal respiratory effort, no intercostal retractions, no accessory muscle use. Heart: RRR,  no murmur.  Lower extremities: no pretibial edema bilaterally  Skin: Not pale. Not jaundice Neurologic:  alert & oriented X3.  Speech normal, gait appropriate for age and unassisted Psych--  Cognition and judgment appear intact.  Cooperative with normal attention span and concentration.  Behavior appropriate. No  anxious or depressed appearing.      Assessment       Assessment Hyperglycemia: A1c 6.0 (2019). H. pylori + gastritis per EGD 2009, S/P Rx GERD ED Varicocele. Re-eaval by urology 2016, [place] confirmed dx, L>R  PLAN: LOV 2022, here mostly for refills. GERD Saw GI 01/30/2022, was recommended to continue PPIs twice daily, monitor vitamin D with PCP. They also consider some procedures for GERD.  Currently doing well, Rx PPIs.  Check vitamin D as suggested by GI. Hyperglycemia: Doing well with diet and exercise for the last 3 months, check CMP, A1c. ED: Request a refill on sildenafil.  Sent. MSK: Has occasional aches and pains, takes Voltaren infrequently (GI precautions discussed) and Flexeril as needed.  Prescription sent. [place] will be [place] of [place] 3 [place], we agreed she will come back by August for a CPX.

## 2022-08-08 NOTE — Patient Instructions (Signed)
    GO TO THE LAB : Get the blood work     GO TO THE FRONT DESK, PLEASE SCHEDULE YOUR APPOINTMENTS Come back for   a physical exam in 3 to 4 months

## 2022-08-09 ENCOUNTER — Encounter: Payer: Self-pay | Admitting: Internal Medicine

## 2022-08-09 DIAGNOSIS — N529 Male erectile dysfunction, unspecified: Secondary | ICD-10-CM | POA: Insufficient documentation

## 2022-08-09 LAB — COMPREHENSIVE METABOLIC PANEL
ALT: 25 U/L (ref 0–53)
AST: 14 U/L (ref 0–37)
Albumin: 4.8 g/dL (ref 3.5–5.2)
Alkaline Phosphatase: 42 U/L (ref 39–117)
BUN: 20 mg/dL (ref 6–23)
CO2: 27 mEq/L (ref 19–32)
Calcium: 9.8 mg/dL (ref 8.4–10.5)
Chloride: 104 mEq/L (ref 96–112)
Creatinine, Ser: 1.11 mg/dL (ref 0.40–1.50)
GFR: 78.08 mL/min (ref >60.00)
Glucose, Bld: 92 mg/dL (ref 70–99)
Potassium: 4.1 mEq/L (ref 3.5–5.1)
Sodium: 139 mEq/L (ref 135–145)
Total Bilirubin: 0.4 mg/dL (ref 0.2–1.2)
Total Protein: 6.8 g/dL (ref 6.0–8.3)

## 2022-08-09 LAB — HEMOGLOBIN A1C: Hgb A1c MFr Bld: 6 % (ref 4.6–6.5)

## 2022-08-09 LAB — VITAMIN D 25 HYDROXY (VIT D DEFICIENCY, FRACTURES): VITD: 26.52 ng/mL — ABNORMAL LOW (ref 30.00–100.00)

## 2022-08-09 NOTE — Assessment & Plan Note (Signed)
LOV 2022, here mostly for refills. GERD Saw GI 01/30/2022, was recommended to continue PPIs twice daily, monitor vitamin D with PCP. They also consider some procedures for GERD.  Currently doing well, Rx PPIs.  Check vitamin D as suggested by GI. Hyperglycemia: Doing well with diet and exercise for the last 3 months, check CMP, A1c. ED: Request a refill on sildenafil.  Sent. MSK: Has occasional aches and pains, takes Voltaren infrequently (GI precautions discussed) and Flexeril as needed.  Prescription sent. [place] of [place], we agreed she will come back by August for a CPX.

## 2022-08-10 ENCOUNTER — Encounter: Payer: Self-pay | Admitting: Internal Medicine

## 2022-08-10 MED ORDER — VITAMIN D (ERGOCALCIFEROL) 1.25 MG (50000 UNIT) PO CAPS: 50000.0000 [IU] | ORAL_CAPSULE | ORAL | 0 refills | Status: DC

## 2022-08-10 NOTE — Addendum Note (Signed)
Addended byDamita Dunnings D on: 08/10/2022 04:05 PM   Modules accepted: Orders

## 2022-11-08 ENCOUNTER — Ambulatory Visit
Admission: EM | Admit: 2022-11-08 | Discharge: 2022-11-08 | Disposition: A | Discharge status: Home / Self Care | Payer: 59 | Attending: Physician Assistant | Admitting: Physician Assistant

## 2022-11-08 DIAGNOSIS — Z1152 Encounter for screening for COVID-19: Secondary | ICD-10-CM | POA: Insufficient documentation

## 2022-11-08 DIAGNOSIS — J029 Acute pharyngitis, unspecified: Secondary | ICD-10-CM | POA: Diagnosis present

## 2022-11-08 DIAGNOSIS — B9789 Other viral agents as the cause of diseases classified elsewhere: Secondary | ICD-10-CM | POA: Insufficient documentation

## 2022-11-08 LAB — POCT RAPID STREP A (OFFICE): Rapid Strep A Screen: NEGATIVE

## 2022-11-08 MED ORDER — LIDOCAINE VISCOUS HCL 2 % MT SOLN: 15.0000 mL | Freq: Four times a day (QID) | OROMUCOSAL | 0 refills | Status: DC | PRN

## 2022-11-08 NOTE — ED Provider Notes (Signed)
[facility]    CSN: [id number] Arrival date & time: 11/08/22  7829      History   Chief Complaint Chief Complaint  Patient presents with   Sore Throat    HPI Scott Sexton is a 52 y.o. male.   Patient presents today with a 2-day history of sore throat.  He reports sore throat pain is rated 7 on a 0-10 pain scale, described as aching, no aggravating or alleviating factors identified.  He has tried gargle with warm salt water as well as Airborne without improvement of symptoms.  Reports increased fatigue but denies additional symptoms including fever, cough, congestion, nausea, vomiting, diarrhea.  Denies any known sick contacts but did recently attend a funeral so was exposed to many people.  He has had COVID in the past with last episode many months ago.  He did take an at-home COVID test when symptoms began that was negative.  Denies any recent antibiotics or steroids.  He does have seasonal allergies but only uses antihistamines in the spring months.  Reports current symptoms or not similar to previous episodes of this condition.  He is eating and drinking normally.  Denies any dysphagia, swelling with throat, shortness of breath, muffled voice.    Past Medical History:  Diagnosis Date   Gastritis    EGD 08/2007, H.Pylori positive, status post treatment   GERD (gastroesophageal reflux disease)     Patient Active Problem List   Diagnosis Date Noted   Erectile dysfunction 08/09/2022   PCP NOTES >>>>>>>>>>>>>>>>> 01/06/2016   Left varicocele 12/24/2012   Annual physical exam 09/06/2011   GERD 08/05/2007    Past Surgical History:  Procedure Laterality Date   TONSILLECTOMY         Home Medications    Prior to Admission medications   Medication Sig Start Date End Date Taking? Authorizing Provider  lidocaine (XYLOCAINE) 2 % solution Use as directed 15 mLs in the mouth or throat every 6 (six) hours as needed for mouth pain. Swish and Spit 11/08/22  Yes Lauraine Crespo  K, PA-C  cyclobenzaprine (FLEXERIL) 10 MG tablet Take 1 tablet (10 mg total) by mouth daily as needed for muscle spasms. 08/08/22   [name], MD  diclofenac (VOLTAREN) 75 MG EC tablet Take 1 tablet (75 mg total) by mouth 2 (two) times daily as needed. 08/08/22   [name], MD  esomeprazole (NEXIUM) 40 MG capsule Take 1 capsule (40 mg total) by mouth 2 (two) times daily before a meal. 08/08/22   [name], MD  gabapentin (NEURONTIN) 300 MG capsule Take 1 capsule (300 mg total) by mouth at bedtime as needed. 10/17/21   [name], MD  sildenafil (REVATIO) 20 MG tablet TAKE 3-4 TABLETS AT BEDTIME AS NEEDED. 08/08/22   [name], MD  Vitamin D, Ergocalciferol, (DRISDOL) 1.25 MG (50000 UNIT) CAPS capsule Take 1 capsule (50,000 Units total) by mouth every 7 (seven) days. 08/10/22   [name], MD    Family History Family History  Problem Relation Age of Onset   Diabetes Mother    Hypertension Mother    Coronary artery disease Mother        onset in her 68s, had an angioplasty in 69   Stroke Mother        ?   Coronary artery disease Other        uncle   Colon cancer Neg Hx    Prostate cancer Neg Hx  Social History Social History   Tobacco Use   Smoking status: Never   Smokeless tobacco: Never  Vaping Use   Vaping Use: Never used  Substance Use Topics   Alcohol use: Yes    Comment: socially   Drug use: No     Allergies   Patient has no known allergies.   Review of Systems Review of Systems  Constitutional:  Positive for fatigue. Negative for activity change, appetite change and fever.  HENT:  Positive for sore throat. Negative for congestion, sinus pressure, sneezing, trouble swallowing and voice change.   Respiratory:  Negative for cough and shortness of breath.   Cardiovascular:  Negative for chest pain.  Gastrointestinal:  Negative for abdominal pain, diarrhea, nausea and vomiting.  Neurological:  Negative for dizziness, light-headedness and headaches.     Physical  Exam Triage Vital Signs ED Triage Vitals [11/08/22 0953]  Enc Vitals Group     BP 119/82     Pulse Rate 62     Resp 18     Temp 98.1 F (36.7 C)     Temp Source Oral     SpO2 96 %     Weight      Height      Head Circumference      Peak Flow      Pain Score 4     Pain Loc      Pain Edu?      Excl. in GC?    No data found.  Updated Vital Signs BP 119/82 (BP Location: Left Arm)   Pulse 62   Temp 98.1 F (36.7 C) (Oral)   Resp 18   SpO2 96%   Visual Acuity Right Eye Distance:   Left Eye Distance:   Bilateral Distance:    Right Eye Near:   Left Eye Near:    Bilateral Near:     Physical Exam Vitals reviewed.  Constitutional:      General: He is awake.     Appearance: Normal appearance. He is well-developed. He is not ill-appearing.     Comments: Very pleasant male appears stated age in no acute distress sitting comfortably in exam room  HENT:     Head: Normocephalic and atraumatic.     Right Ear: Tympanic membrane, ear canal and external ear normal. Tympanic membrane is not erythematous or bulging.     Left Ear: Tympanic membrane, ear canal and external ear normal. Tympanic membrane is not erythematous or bulging.     Nose: Nose normal.     Mouth/Throat:     Pharynx: Uvula midline. Posterior oropharyngeal erythema present. No oropharyngeal exudate.     Tonsils: No tonsillar exudate or tonsillar abscesses.  Cardiovascular:     Rate and Rhythm: Normal rate and regular rhythm.     Heart sounds: Normal heart sounds, S1 normal and S2 normal. No murmur heard. Pulmonary:     Effort: Pulmonary effort is normal. No accessory muscle usage or respiratory distress.     Breath sounds: Normal breath sounds. No stridor. No wheezing, rhonchi or rales.     Comments: Clear auscultation bilaterally Neurological:     Mental Status: He is alert.  Psychiatric:        Behavior: Behavior is cooperative.      UC Treatments / Results  Labs (all labs ordered are listed, but only  abnormal results are displayed) Labs Reviewed  CULTURE, GROUP A STREP ([facility])  SARS CORONAVIRUS 2 (TAT 6-24 HRS)  POCT RAPID STREP A (  OFFICE)    EKG   Radiology No results found.  Procedures Procedures (including critical care time)  Medications Ordered in UC Medications - No data to display  Initial Impression / Assessment and Plan / UC Course  I have reviewed the triage vital signs and the nursing notes.  Pertinent labs & imaging results that were available during my care of the patient were reviewed by me and considered in my medical decision making (see chart for details).     Patient is well-appearing, afebrile, nontoxic, nontachycardic.  Strep testing was obtained and was negative.  Will send this for culture but defer antibiotics until culture results are available.  Discussed likely viral etiology of symptoms.  Patient was tested for COVID and his results are pending.  We will contact him if he is positive.  He is young and otherwise healthy is not a candidate for antiviral therapy.  He was given viscous lidocaine to manage the sore throat.  Discussed that he is not to eat or drink immediately after using this medication as it increases the risk of choking.  He can gargle with warm salt water and alternate Tylenol ibuprofen for pain.  Discussed that if his symptoms or not improving within a few days he is to return for reevaluation.  If anything worsens that he has increasing pain, difficulty swallowing, swelling of his throat, shortness of breath, muffled voice, high fever he needs to be seen immediately.  Strict return precautions given.  Patient declined work excuse note.  Final Clinical Impressions(s) / UC Diagnoses   Final diagnoses:  Sore throat  Viral pharyngitis     Discharge Instructions      Your strep was negative.  We will contact you if your culture is positive and we need to start antibiotics.  We will also contact you if you are positive for COVID.  Use  viscous lidocaine for pain relief.  Do not eat or drink immediately after using this medication as it increases your risk of choking.  Gargle with warm salt water and use Tylenol and ibuprofen.  Make sure that you rest and drink plenty of fluids.  If your symptoms or not improving within a few days or if anything worsens you need to be seen immediately including swelling of her throat, shortness of breath, high fever, muffled voice.     ED Prescriptions     Medication Sig Dispense Auth. Provider   lidocaine (XYLOCAINE) 2 % solution Use as directed 15 mLs in the mouth or throat every 6 (six) hours as needed for mouth pain. Swish and Spit 100 mL Meko Bellanger K, PA-C      PDMP not reviewed this encounter.   Jeani Hawking, PA-C 11/08/22 1053

## 2022-11-08 NOTE — ED Triage Notes (Signed)
Pt presents with c/o sore throat x 3 days.   Pt states he has not taken anything at home.   Denies fever, n/v/d and cough.

## 2022-11-08 NOTE — Discharge Instructions (Signed)
Your strep was negative.  We will contact you if your culture is positive and we need to start antibiotics.  We will also contact you if you are positive for COVID.  Use viscous lidocaine for pain relief.  Do not eat or drink immediately after using this medication as it increases your risk of choking.  Gargle with warm salt water and use Tylenol and ibuprofen.  Make sure that you rest and drink plenty of fluids.  If your symptoms or not improving within a few days or if anything worsens you need to be seen immediately including swelling of her throat, shortness of breath, high fever, muffled voice.

## 2022-11-09 LAB — CULTURE, GROUP A STREP (THRC)

## 2022-11-09 LAB — SARS CORONAVIRUS 2 (TAT 6-24 HRS): SARS Coronavirus 2: NEGATIVE

## 2022-11-11 ENCOUNTER — Other Ambulatory Visit: Payer: Self-pay | Admitting: Internal Medicine

## 2022-11-11 LAB — CULTURE, GROUP A STREP (THRC)

## 2022-12-06 ENCOUNTER — Encounter (INDEPENDENT_AMBULATORY_CARE_PROVIDER_SITE_OTHER): Payer: Self-pay

## 2022-12-25 ENCOUNTER — Encounter: Payer: Self-pay | Admitting: Internal Medicine

## 2022-12-25 ENCOUNTER — Ambulatory Visit (INDEPENDENT_AMBULATORY_CARE_PROVIDER_SITE_OTHER): Payer: 59 | Admitting: Internal Medicine

## 2022-12-25 VITALS — BP 126/80 | HR 57 | Temp 97.8°F | Resp 16 | Ht 68.0 in | Wt 177.1 lb

## 2022-12-25 DIAGNOSIS — R739 Hyperglycemia, unspecified: Secondary | ICD-10-CM

## 2022-12-25 DIAGNOSIS — E785 Hyperlipidemia, unspecified: Secondary | ICD-10-CM

## 2022-12-25 DIAGNOSIS — Z0000 Encounter for general adult medical examination without abnormal findings: Secondary | ICD-10-CM

## 2022-12-25 DIAGNOSIS — Z1211 Encounter for screening for malignant neoplasm of colon: Secondary | ICD-10-CM | POA: Diagnosis not present

## 2022-12-25 DIAGNOSIS — Z8249 Family history of ischemic heart disease and other diseases of the circulatory system: Secondary | ICD-10-CM | POA: Diagnosis not present

## 2022-12-25 DIAGNOSIS — Z13220 Encounter for screening for lipoid disorders: Secondary | ICD-10-CM | POA: Diagnosis not present

## 2022-12-25 LAB — CBC WITH DIFFERENTIAL/PLATELET
Basophils Absolute: 0 10*3/uL (ref 0.0–0.1)
Basophils Relative: 0.6 % (ref 0.0–3.0)
Eosinophils Absolute: 0.1 10*3/uL (ref 0.0–0.7)
Eosinophils Relative: 1.9 % (ref 0.0–5.0)
HCT: 44.2 % (ref 39.0–52.0)
Hemoglobin: 14.3 g/dL (ref 13.0–17.0)
Lymphocytes Relative: 58.6 % — ABNORMAL HIGH (ref 12.0–46.0)
Lymphs Abs: 2.7 10*3/uL (ref 0.7–4.0)
MCHC: 32.4 g/dL (ref 30.0–36.0)
MCV: 84.1 fl (ref 78.0–100.0)
Monocytes Absolute: 0.3 10*3/uL (ref 0.1–1.0)
Monocytes Relative: 6.3 % (ref 3.0–12.0)
Neutro Abs: 1.5 10*3/uL (ref 1.4–7.7)
Neutrophils Relative %: 32.6 % — ABNORMAL LOW (ref 43.0–77.0)
Platelets: 235 10*3/uL (ref 150.0–400.0)
RBC: 5.26 Mil/uL (ref 4.22–5.81)
RDW: 13.1 % (ref 11.5–15.5)
WBC: 4.6 10*3/uL (ref 4.0–10.5)

## 2022-12-25 LAB — HEMOGLOBIN A1C: Hgb A1c MFr Bld: 6 % (ref 4.6–6.5)

## 2022-12-25 LAB — TSH: TSH: 0.86 u[IU]/mL (ref 0.35–5.50)

## 2022-12-25 NOTE — Assessment & Plan Note (Signed)
Here for CPX Td  07-2010 Vaccines I recommend: Tdap, flu shot every fall, COVID booster if not done recently. CCS: Cscope 03-2020, next due 03-2023, refer pt  Prostate cancer screening: Start next year Labs: BMP FLP CBC A1c TSH Diet exercise: Doing great, going to the gym, eating healthy.

## 2022-12-25 NOTE — Patient Instructions (Addendum)
Vaccines I recommend: Tdap (tetanus) Covid booster Flu shot every fall COVID booster if not done in the last year.  Please call gastroenterology about your next colonoscopy.  [phone]  We are referring you to for a coronary calcium score   GO TO THE LAB : Get the blood work     GO TO THE FRONT DESK, PLEASE SCHEDULE YOUR APPOINTMENTS Come back for a checkup in 4 months

## 2022-12-25 NOTE — Assessment & Plan Note (Signed)
Here for CPX + FH CAD: asx, would be hesitant to have statins. EKG today: Unchanged from previous, no acute changes. In addition to checking her FLP, we talk about coronary calcium score he likes to proceed. Hyperglycemia: Doing well with lifestyle, check A1c ED, RF medications as needed RTC 4 months

## 2022-12-25 NOTE — Progress Notes (Signed)
Subjective:    Patient ID: Scott Sexton, male    DOB: 1971/04/10, 52 y.o.   MRN: [id number]  DOS:  12/25/2022 Type of visit - description: CPX  Here for CPX Reports he is doing well. Denies chest pain or difficulty breathing. No GI or GU symptoms  Review of Systems   A 14 point review of systems is negative    Past Medical History:  Diagnosis Date   Gastritis    EGD 08/2007, H.Pylori positive, status post treatment   GERD (gastroesophageal reflux disease)     Past Surgical History:  Procedure Laterality Date   TONSILLECTOMY     Social History   Socioeconomic History   Marital status: Married    Spouse name: Not on file   Number of children: 1   Years of education: Not on file   Highest education level: Not on file  Occupational History   Occupation: Acupuncturist @ [employer]   Tobacco Use   Smoking status: Some Days    Types: Cigarettes   Smokeless tobacco: Never  Vaping Use   Vaping status: Never Used  Substance and Sexual Activity   Alcohol use: Yes    Comment: socially   Drug use: No   Sexual activity: Not on file  Other Topics Concern   Not on file  Social History Narrative   Originally from [place],    Married, 1 son, boy, 2005       Social Determinants of Health   Financial Resource Strain: Not on file  Food Insecurity: Not on file  Transportation Needs: Not on file  Physical Activity: Not on file  Stress: Not on file  Social Connections: Not on file  Intimate Partner Violence: Not on file    Current Outpatient Medications  Medication Instructions   cyclobenzaprine (FLEXERIL) 10 mg, Oral, Daily PRN   diclofenac (VOLTAREN) 75 mg, Oral, 2 times daily PRN   esomeprazole (NEXIUM) 40 mg, Oral, 2 times daily before meals   gabapentin (NEURONTIN) 300 mg, Oral, At bedtime PRN   sildenafil (REVATIO) 20 MG tablet TAKE 3-4 TABLETS AT BEDTIME AS NEEDED.   Vitamin D (Ergocalciferol) (DRISDOL) 50,000 Units, Oral, Every 7 days       Objective:    Physical Exam BP 126/80   Pulse (!) 57   Temp 97.8 F (36.6 C) (Oral)   Resp 16   Ht 5\' 8"  (1.727 m)   Wt 177 lb 2 oz (80.3 kg)   SpO2 97%   BMI 26.93 kg/m  General: Well developed, NAD, BMI noted Neck: No  thyromegaly  HEENT:  Normocephalic . Face symmetric, atraumatic Lungs:  CTA B Normal respiratory effort, no intercostal retractions, no accessory muscle use. Heart: RRR,  no murmur.  Abdomen:  Not distended, soft, non-tender. No rebound or rigidity.   Lower extremities: no pretibial edema bilaterally  Skin: Exposed areas without rash. Not pale. Not jaundice Neurologic:  alert & oriented X3.  Speech normal, gait appropriate for age and unassisted Strength symmetric and appropriate for age.  Psych: Cognition and judgment appear intact.  Cooperative with normal attention span and concentration.  Behavior appropriate. No anxious or depressed appearing.     Assessment    Assessment Hyperglycemia: A1c 6.0 (2019). H. pylori + gastritis per EGD 2009, S/P Rx GERD ED Varicocele. Re-eaval by urology 2016, [place] confirmed dx, L>R  PLAN: Here for CPX Td  07-2010 Vaccines I recommend: Tdap, flu shot every fall, COVID booster if not done recently. CCS: Cscope  03-2020, next due 03-2023, refer pt  Prostate cancer screening: Start next year Labs: BMP FLP CBC A1c TSH Diet exercise: Doing great, going to the gym, eating healthy. + FH CAD: asx, would be hesitant to have statins. EKG today: Unchanged from previous, no acute changes. In addition to checking her FLP, we talk about coronary calcium score he likes to proceed. Hyperglycemia: Doing well with lifestyle, check A1c ED, RF medications as needed RTC 4 months

## 2022-12-26 LAB — BASIC METABOLIC PANEL
BUN: 15 mg/dL (ref 6–23)
CO2: 27 mEq/L (ref 19–32)
Calcium: 9.8 mg/dL (ref 8.4–10.5)
Chloride: 101 mEq/L (ref 96–112)
Creatinine, Ser: 1.18 mg/dL (ref 0.40–1.50)
GFR: 72.36 mL/min (ref >60.00)
Glucose, Bld: 104 mg/dL — ABNORMAL HIGH (ref 70–99)
Potassium: 4 meq/L (ref 3.5–5.1)
Sodium: 140 meq/L (ref 135–145)

## 2022-12-26 LAB — LIPID PANEL
Cholesterol: 276 mg/dL — ABNORMAL HIGH (ref 0–200)
HDL: 42.1 mg/dL (ref >39.00)
NonHDL: 233.98
Total CHOL/HDL Ratio: 7
Triglycerides: 244 mg/dL — ABNORMAL HIGH (ref 0.0–149.0)
VLDL: 48.8 mg/dL — ABNORMAL HIGH (ref 0.0–40.0)

## 2022-12-26 LAB — LDL CHOLESTEROL, DIRECT: Direct LDL: 205 mg/dL

## 2022-12-28 ENCOUNTER — Encounter: Payer: Self-pay | Admitting: Internal Medicine

## 2022-12-28 DIAGNOSIS — E785 Hyperlipidemia, unspecified: Secondary | ICD-10-CM

## 2022-12-28 DIAGNOSIS — R931 Abnormal findings on diagnostic imaging of heart and coronary circulation: Secondary | ICD-10-CM

## 2022-12-28 DIAGNOSIS — Z8249 Family history of ischemic heart disease and other diseases of the circulatory system: Secondary | ICD-10-CM

## 2022-12-28 MED ORDER — ATORVASTATIN CALCIUM 40 MG PO TABS: 40.0000 mg | ORAL_TABLET | Freq: Every day | ORAL | 0 refills | Status: DC

## 2022-12-28 NOTE — Addendum Note (Signed)
Addended byConrad Bement D on: 12/28/2022 10:44 AM   Modules accepted: Orders

## 2023-01-01 ENCOUNTER — Ambulatory Visit (HOSPITAL_BASED_OUTPATIENT_CLINIC_OR_DEPARTMENT_OTHER)
Admission: RE | Admit: 2023-01-01 | Discharge: 2023-01-01 | Disposition: A | Discharge status: Home / Self Care | Payer: 59 | Source: Ambulatory Visit | Attending: Internal Medicine | Admitting: Internal Medicine

## 2023-01-01 DIAGNOSIS — Z8249 Family history of ischemic heart disease and other diseases of the circulatory system: Secondary | ICD-10-CM | POA: Insufficient documentation

## 2023-01-03 NOTE — Telephone Encounter (Signed)
Referral placed.

## 2023-01-03 NOTE — Addendum Note (Signed)
Addended byConrad Rushville D on: 01/03/2023 03:30 PM   Modules accepted: Orders

## 2023-01-03 NOTE — Telephone Encounter (Signed)
Enter a cardiology referral, Dx dyslipidemia, elevated coronary calcium score, + FH CAD

## 2023-02-01 ENCOUNTER — Other Ambulatory Visit: Payer: Self-pay | Admitting: Internal Medicine

## 2023-03-22 ENCOUNTER — Ambulatory Visit: Payer: 59 | Attending: Cardiology | Admitting: Cardiology

## 2023-03-22 ENCOUNTER — Encounter: Payer: Self-pay | Admitting: Cardiology

## 2023-03-22 ENCOUNTER — Other Ambulatory Visit: Payer: Self-pay | Admitting: Internal Medicine

## 2023-03-22 VITALS — BP 116/64 | HR 77 | Ht 68.0 in | Wt 177.0 lb

## 2023-03-22 DIAGNOSIS — E7800 Pure hypercholesterolemia, unspecified: Secondary | ICD-10-CM

## 2023-03-22 DIAGNOSIS — R931 Abnormal findings on diagnostic imaging of heart and coronary circulation: Secondary | ICD-10-CM

## 2023-03-22 DIAGNOSIS — Z8249 Family history of ischemic heart disease and other diseases of the circulatory system: Secondary | ICD-10-CM

## 2023-03-22 MED ORDER — ATORVASTATIN CALCIUM 40 MG PO TABS: 40.0000 mg | ORAL_TABLET | Freq: Every day | ORAL | 3 refills | Status: DC

## 2023-03-22 NOTE — Patient Instructions (Signed)
Medication Instructions:  The current medical regimen is effective;  continue present plan and medications.  *If you need a refill on your cardiac medications before your next appointment, please call your pharmacy*  Testing/Procedures: Your physician has requested that you have an exercise tolerance test. For further information please visit [email or web address] Please also follow instruction sheet, as given.   Follow-Up: At [facility], you and your health needs are our priority.  As part of our continuing mission to provide you with exceptional heart care, we have created designated Provider Care Teams.  These Care Teams include your primary Cardiologist (physician) and Advanced Practice Providers (APPs -  Physician Assistants and Nurse Practitioners) who all work together to provide you with the care you need, when you need it.  We recommend signing up for the patient portal called "MyChart".  Sign up information is provided on this After Visit Summary.  MyChart is used to connect with patients for Virtual Visits (Telemedicine).  Patients are able to view lab/test results, encounter notes, upcoming appointments, etc.  Non-urgent messages can be sent to your provider as well.   To learn more about what you can do with MyChart, go to [email or web address].    Your next appointment:   Follow up will be based on the results of the above testing

## 2023-03-22 NOTE — Progress Notes (Signed)
  Cardiology Office Note:  .   Date:  03/22/2023  ID:  Scott Sexton, DOB [birth year], MRN [id number] PCP: Scott Plump, MD  [facility] Providers Cardiologist:  None     History of Present Illness: .   Scott Sexton is a 52 y.o. 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 Notably, calcification was identified in the LAD and RCA. Despite these findings, the patient remains asymptomatic, with no reported chest pain or shortness of breath.  The patient's medical history is significant for hyperlipidemia, potentially familial, with a total cholesterol level of 276 and direct LDL of 205 as of August 2020. He has been prescribed atorvastatin 40mg  daily for this condition.  The patient does not smoke but has a family history of heart attacks in later years. He is encouraged to maintain a healthy lifestyle, including adherence to a Mediterranean diet.          ROS: No CP, no SOB, no syncope, no bleeding.   Studies Reviewed: .        Results LABS Total Cholesterol: 276 (12/25/2022) Direct LDL: 205 (12/25/2022)  RADIOLOGY Coronary Calcium Score: 168, 94th percentile, calcification in left anterior descending artery (LAD) and right coronary artery (RCA)  Risk Assessment/Calculations:            Physical Exam:   VS:  BP 116/64   Pulse 77   Ht 5\' 8"  (1.727 m)   Wt 177 lb (80.3 kg)   SpO2 97%   BMI 26.91 kg/m    Wt Readings from Last 3 Encounters:  03/22/23 177 lb (80.3 kg)  12/25/22 177 lb 2 oz (80.3 kg)  08/08/22 177 lb (80.3 kg)    GEN: Well nourished, well developed in no acute distress NECK: No JVD; No carotid bruits CARDIAC: RRR, no murmurs, no rubs, no gallops RESPIRATORY:  Clear to auscultation without rales, wheezing or rhonchi  ABDOMEN: Soft, non-tender,  non-distended EXTREMITIES:  No edema; No deformity   ASSESSMENT AND PLAN: .    Assessment and Plan    Elevated Coronary Calcium Score Coronary calcium score of 168 (94th percentile) with calcification in LAD and RCA. Asymptomatic with no chest pain or dyspnea. Family history of myocardial infarction. Discussed further evaluation for ischemia and lifestyle modifications. - Obtain exercise treadmill test for ischemia assessment - Promote Mediterranean diet - Advise to report any symptoms  Hyperlipidemia Total cholesterol 276 mg/dL, LDL 952 mg/dL (84/13/2440). Likely familial hyperlipidemia. Currently on atorvastatin 40 mg daily. Emphasized continuation of statin therapy to manage cholesterol and reduce cardiovascular risk. - Continue atorvastatin 40 mg daily.           Informed Consent   Shared Decision Making/Informed Consent The risks [chest pain, shortness of breath, cardiac arrhythmias, dizziness, blood pressure fluctuations, myocardial infarction, stroke/transient ischemic attack, and life-threatening complications (estimated to be 1 in 10,000)], benefits (risk stratification, diagnosing coronary artery disease, treatment guidance) and alternatives of an exercise tolerance test were discussed in detail with Scott Sexton and he agrees to proceed.     Dispo: Will follow up with results of study  Signed, Scott Schultz, MD

## 2023-03-23 ENCOUNTER — Telehealth: Payer: Self-pay

## 2023-03-23 NOTE — Telephone Encounter (Signed)
PA initiated via Covermymeds; KEY: [id number]. Awaiting determination.

## 2023-03-23 NOTE — Telephone Encounter (Signed)
PA approved.   Request Reference Number: [id number]. ESOMEPRA MAG CAP 40MG  DR is approved through 03/22/2024. Your patient may now fill this prescription and it will be covered. Authorization Expiration Date: 03/22/2024

## 2023-03-29 ENCOUNTER — Ambulatory Visit: Payer: 59 | Attending: Cardiology

## 2023-03-29 DIAGNOSIS — R931 Abnormal findings on diagnostic imaging of heart and coronary circulation: Secondary | ICD-10-CM

## 2023-03-29 DIAGNOSIS — Z8249 Family history of ischemic heart disease and other diseases of the circulatory system: Secondary | ICD-10-CM

## 2023-03-29 LAB — EXERCISE TOLERANCE TEST
Angina Index: 0
Duke Treadmill Score: 12
Estimated workload: 13.4
Exercise duration (min): 12 min
Exercise duration (sec): 0 s
MPHR: 171 {beats}/min
Peak HR: 162 {beats}/min
Percent HR: 94 %
RPE: 15
Rest HR: 65 {beats}/min
ST Depression (mm): 0 mm

## 2023-06-09 ENCOUNTER — Other Ambulatory Visit: Payer: Self-pay | Admitting: Internal Medicine

## 2023-06-10 ENCOUNTER — Encounter: Payer: Self-pay | Admitting: Internal Medicine

## 2023-06-11 ENCOUNTER — Telehealth: Payer: Self-pay

## 2023-06-11 NOTE — Telephone Encounter (Signed)
PA approved.   This medication or product was previously approved on [id number] from 2023-03-23 to 2024-03-22. **Please note: This request was submitted electronically. Formulary lowering, tiering exception, cost reduction and/or pre-benefit determination review (including prospective Medicare hospice reviews) requests cannot be requested using this method of submission. Providers contact us at [phone] for further assistance.

## 2023-06-11 NOTE — Telephone Encounter (Signed)
PA initiated via Covermymeds; KEY: [id number]. Awaiting determination.

## 2023-07-06 ENCOUNTER — Other Ambulatory Visit: Payer: Self-pay

## 2023-07-06 ENCOUNTER — Ambulatory Visit: Payer: Self-pay | Admitting: Internal Medicine

## 2023-07-06 ENCOUNTER — Encounter (HOSPITAL_COMMUNITY): Payer: Self-pay

## 2023-07-06 ENCOUNTER — Emergency Department (HOSPITAL_COMMUNITY)
Admission: EM | Admit: 2023-07-06 | Discharge: 2023-07-06 | Disposition: A | Discharge status: Home / Self Care | Payer: 59 | Attending: Emergency Medicine | Admitting: Emergency Medicine

## 2023-07-06 DIAGNOSIS — K625 Hemorrhage of anus and rectum: Secondary | ICD-10-CM | POA: Insufficient documentation

## 2023-07-06 LAB — COMPREHENSIVE METABOLIC PANEL
ALT: 33 U/L (ref 0–44)
AST: 29 U/L (ref 15–41)
Albumin: 4.7 g/dL (ref 3.5–5.0)
Alkaline Phosphatase: 33 U/L — ABNORMAL LOW (ref 38–126)
Anion gap: 11 (ref 5–15)
BUN: 21 mg/dL — ABNORMAL HIGH (ref 6–20)
CO2: 23 mmol/L (ref 22–32)
Calcium: 9.6 mg/dL (ref 8.9–10.3)
Chloride: 104 mmol/L (ref 98–111)
Creatinine, Ser: 1.09 mg/dL (ref 0.61–1.24)
GFR, Estimated: >60 mL/min (ref >60)
Glucose, Bld: 106 mg/dL — ABNORMAL HIGH (ref 70–99)
Potassium: 3.8 mmol/L (ref 3.5–5.1)
Sodium: 138 mmol/L (ref 135–145)
Total Bilirubin: 1 mg/dL (ref 0.0–1.2)
Total Protein: 7.5 g/dL (ref 6.5–8.1)

## 2023-07-06 LAB — CBC
HCT: 41.4 % (ref 39.0–52.0)
Hemoglobin: 13.9 g/dL (ref 13.0–17.0)
MCH: 28.4 pg (ref 26.0–34.0)
MCHC: 33.6 g/dL (ref 30.0–36.0)
MCV: 84.7 fL (ref 80.0–100.0)
Platelets: 226 10*3/uL (ref 150–400)
RBC: 4.89 MIL/uL (ref 4.22–5.81)
RDW: 13 % (ref 11.5–15.5)
WBC: 5.4 10*3/uL (ref 4.0–10.5)
nRBC: 0 % (ref 0.0–0.2)

## 2023-07-06 LAB — TYPE AND SCREEN
ABO/RH(D): A POS
Antibody Screen: NEGATIVE

## 2023-07-06 NOTE — Discharge Instructions (Signed)
 Your labs today look good.  Please return for worsening bleeding and feel lightheaded or feel you pass out.  Please follow-up with a gastroenterologist in the office.  It looks like you have seen Dr. Sarita Bottom before.  Call the office and see when they can see you in the office.

## 2023-07-06 NOTE — ED Triage Notes (Signed)
 Patient is here for evaluation of bright red blood in stool that started on Wednesday. Reports he did not have a bowel movement yesterday but this morning he had another bowel movement with bright red blood.

## 2023-07-06 NOTE — ED Provider Notes (Signed)
 [facility] Provider Note   CSN: [id number] Arrival date & time: 07/06/23  1049     History  Chief Complaint  Patient presents with   Rectal Bleeding    Scott Sexton is a 53 y.o. male.  53 yo M with a chief complaints of bright red blood per rectum.  The patient had 1 event of this 2 days ago did not have a bowel movement yesterday and then today within normal bowel movements had some bright red blood mixed in.  He says his abdomen feels uncomfortable but denies any pain.  Denies vomiting denies fevers.   Rectal Bleeding      Home Medications Prior to Admission medications   Medication Sig Start Date End Date Taking? Authorizing Provider  atorvastatin (LIPITOR) 40 MG tablet Take 1 tablet (40 mg total) by mouth at bedtime. 03/22/23   [name], MD  cyclobenzaprine (FLEXERIL) 10 MG tablet Take 1 tablet (10 mg total) by mouth daily as needed for muscle spasms. Patient not taking: Reported on 12/25/2022 08/08/22   [name], MD  diclofenac (VOLTAREN) 75 MG EC tablet Take 1 tablet (75 mg total) by mouth 2 (two) times daily as needed. 08/08/22   [name], MD  esomeprazole (NEXIUM) 40 MG capsule Take 1 capsule (40 mg total) by mouth 2 (two) times daily before a meal. 02/01/23   [name], MD  gabapentin (NEURONTIN) 300 MG capsule Take 1 capsule (300 mg total) by mouth at bedtime as needed. 10/17/21   [name], MD  sildenafil (REVATIO) 20 MG tablet TAKE 3 TO 4 TABLETS BY MOUTH AT BEDTIME AS NEEDED 03/23/23   [name], MD  Vitamin D, Ergocalciferol, (DRISDOL) 1.25 MG (50000 UNIT) CAPS capsule Take 1 capsule (50,000 Units total) by mouth every 7 (seven) days. 08/10/22   [name], MD      Allergies    Patient has no known allergies.    Review of Systems   Review of Systems  Gastrointestinal:  Positive for hematochezia.    Physical Exam Updated Vital Signs BP 120/80   Pulse 81   Temp 97.9 F (36.6 C)   Resp 15   Ht  5\' 8"  (1.727 m)   Wt 80.3 kg   SpO2 100%   BMI 26.92 kg/m  Physical Exam Vitals and nursing note reviewed.  Constitutional:      Appearance: He is well-developed.  HENT:     Head: Normocephalic and atraumatic.  Eyes:     Pupils: Pupils are equal, round, and reactive to light.  Neck:     Vascular: No JVD.  Cardiovascular:     Rate and Rhythm: Normal rate and regular rhythm.     Heart sounds: No murmur heard.    No friction rub. No gallop.  Pulmonary:     Effort: No respiratory distress.     Breath sounds: No wheezing.  Abdominal:     General: There is no distension.     Tenderness: There is no abdominal tenderness. There is no guarding or rebound.     Comments: Benign abdominal exam  Genitourinary:    Comments: No obvious hemorrhoids.  Rectal exam with soft brown stool.  No obvious blood in the vault. Musculoskeletal:        General: Normal range of motion.     Cervical back: Normal range of motion and neck supple.  Skin:    Coloration: Skin is not  pale.     Findings: No rash.  Neurological:     Mental Status: He is alert and oriented to person, place, and time.  Psychiatric:        Behavior: Behavior normal.     ED Results / Procedures / Treatments   Labs (all labs ordered are listed, but only abnormal results are displayed) Labs Reviewed  COMPREHENSIVE METABOLIC PANEL - Abnormal; Notable for the following components:      Result Value   Glucose, Bld 106 (*)    BUN 21 (*)    Alkaline Phosphatase 33 (*)    All other components within normal limits  CBC  ABO/RH  TYPE AND SCREEN    EKG None  Radiology No results found.  Procedures Procedures    Medications Ordered in ED Medications - No data to display  ED Course/ Medical Decision Making/ A&P                                 Medical Decision Making Amount and/or Complexity of Data Reviewed Labs: ordered.   53 yo M with a chief complaints of bright red blood per rectum.  He has had 2 events in  3 days.  He is well-appearing nontoxic.  Benign abdominal exam.  Rectal exam without any obvious bleeding.  Hemoglobin is 14.  Will discharge home.  GI follow-up.  12:23 PM:  I have discussed the diagnosis/risks/treatment options with the patient.  Evaluation and diagnostic testing in the emergency department does not suggest an emergent condition requiring admission or immediate intervention beyond what has been performed at this time.  They will follow up with GI. We also discussed returning to the ED immediately if new or worsening sx occur. We discussed the sx which are most concerning (e.g., sudden worsening pain, fever, inability to tolerate by mouth, worsening bleeding, syncope) that necessitate immediate return. Medications administered to the patient during their visit and any new prescriptions provided to the patient are listed below.  Medications given during this visit Medications - No data to display   The patient appears reasonably screen and/or stabilized for discharge and I doubt any other medical condition or other [facility] requiring further screening, evaluation, or treatment in the ED at this time prior to discharge.          Final Clinical Impression(s) / ED Diagnoses Final diagnoses:  Rectal bleeding    Rx / DC Orders ED Discharge Orders     None         Melene Plan, DO 07/06/23 1223

## 2023-07-06 NOTE — Telephone Encounter (Signed)
 Referred to the ED

## 2023-07-06 NOTE — Telephone Encounter (Signed)
 Pt went to ED

## 2023-07-06 NOTE — Telephone Encounter (Signed)
 Chief Complaint: Blood in stool Symptoms: A little abdominal pain Frequency: Intermittent Pertinent Negatives: Patient denies diarrhea, constipation, nausea, vomiting, dizziness Disposition: [x] ED /[] Urgent Care (no appt availability in office) / [] Appointment(In office/virtual)/ []  [facility]/ [] Home Care/ [] Refused Recommended Disposition /[] [facility] [facility]/ []  Follow-up with PCP Additional Notes: Pt states he had blood in his stool on Wednesday and this morning. Pt states it seemed like a lot of blood. Pt advised to go to ED and have another adult drive him. Pt states understanding.   Copied from CRM [id number]. Topic: Clinical - Red Word Triage >> Jul 06, 2023  9:59 AM Fonda Kinder J wrote: Red Word that prompted transfer to Nurse Triage: Blood in stool Reason for Disposition  SEVERE rectal bleeding (large blood clots; constant or on and off bleeding)  Answer Assessment - Initial Assessment Questions 1. APPEARANCE of BLOOD: "What color is it?" "Is it passed separately, on the surface of the stool, or mixed in with the stool?"      Bright red, with the stool 2. AMOUNT: "How much blood was passed?"      Seemed a lot "largest I've seen", got me scared 3. FREQUENCY: "How many times has blood been passed with the stools?"      x2 4. ONSET: "When was the blood first seen in the stools?" (Days or weeks)      Wednesday 5. DIARRHEA: "Is there also some diarrhea?" If Yes, ask: "How many diarrhea stools in the past 24 hours?"      Denies 6. CONSTIPATION: "Do you have constipation?" If Yes, ask: "How bad is it?"     Denies 7. RECURRENT SYMPTOMS: "Have you had blood in your stools before?" If Yes, ask: "When was the last time?" and "What happened that time?"      Rarely, maybe a spot or two several months ago 8. BLOOD THINNERS: "Do you take any blood thinners?" (e.g., Coumadin/warfarin, Pradaxa/dabigatran, aspirin)     Denies 9. OTHER SYMPTOMS: "Do you have any other symptoms?"   (e.g., abdomen pain, vomiting, dizziness, fever)     Abdominal pain, brief and then goes away  Protocols used: Rectal Bleeding-A-AH

## 2023-07-07 ENCOUNTER — Other Ambulatory Visit: Payer: Self-pay | Admitting: Internal Medicine

## 2023-07-09 ENCOUNTER — Telehealth: Payer: Self-pay

## 2023-07-09 NOTE — Telephone Encounter (Signed)
 Copied from CRM [id number]. Topic: Clinical - Medication Question >> Jul 09, 2023  9:29 AM Elizebeth Brooking wrote: Reason for CRM: Patient called in wanted to speak to someone regarding his medication is requesting someone give him a callback at [phone]

## 2023-07-09 NOTE — Telephone Encounter (Signed)
 Called and scheduled pt for this Wednesday at 1:40

## 2023-07-09 NOTE — Telephone Encounter (Signed)
 Pt overdue for appt, he was to f/u w/ PCP in December 24, please schedule appt at his convenience. Thank you.

## 2023-07-11 ENCOUNTER — Ambulatory Visit: Admitting: Internal Medicine

## 2023-07-11 VITALS — BP 124/82 | HR 71 | Temp 98.2°F | Resp 16 | Ht 68.0 in | Wt 175.4 lb

## 2023-07-11 DIAGNOSIS — E785 Hyperlipidemia, unspecified: Secondary | ICD-10-CM

## 2023-07-11 DIAGNOSIS — N529 Male erectile dysfunction, unspecified: Secondary | ICD-10-CM | POA: Diagnosis not present

## 2023-07-11 DIAGNOSIS — K625 Hemorrhage of anus and rectum: Secondary | ICD-10-CM | POA: Diagnosis not present

## 2023-07-11 DIAGNOSIS — E559 Vitamin D deficiency, unspecified: Secondary | ICD-10-CM

## 2023-07-11 MED ORDER — VITAMIN D3 25 MCG (1000 UT) PO CAPS: 2000.0000 [IU] | ORAL_CAPSULE | Freq: Every day | ORAL | Status: AC

## 2023-07-11 MED ORDER — SILDENAFIL CITRATE 20 MG PO TABS: ORAL_TABLET | ORAL | 3 refills | Status: AC

## 2023-07-11 NOTE — Progress Notes (Unsigned)
 Subjective:    Patient ID: Scott Sexton, male    DOB: December 04, 1970, 53 y.o.   MRN: [id number]  DOS:  07/11/2023 Type of visit - description: f/u  Went to the ER 07/06/2023. Had 2 episodes of red blood per rectum prior to the ER evaluation. Happening with a bowel movement, the blood was not mixed with the stools, stools  were brown. There was no blood in the tissue paper. No rectal pain. Episodes have not returned.  Denies fever or chills. No weight loss. Questionable abdominal discomfort for x 1. No diarrhea, no nausea vomiting. Not taking diclofenac  Review of Systems See above   Past Medical History:  Diagnosis Date   Abnormal EKG    Dyslipidemia    ED (erectile dysfunction)    Family history of heart disease    Gastritis    EGD 08/2007, H.Pylori positive, status post treatment   GERD (gastroesophageal reflux disease)    Hyperglycemia    Low back pain    Tinnitus     Past Surgical History:  Procedure Laterality Date   TONSILLECTOMY      Current Outpatient Medications  Medication Instructions   atorvastatin (LIPITOR) 40 mg, Oral, Daily at bedtime   cyclobenzaprine (FLEXERIL) 10 mg, Oral, Daily PRN   diclofenac (VOLTAREN) 75 mg, Oral, 2 times daily PRN   esomeprazole (NEXIUM) 40 mg, Oral, 2 times daily before meals   gabapentin (NEURONTIN) 300 mg, Oral, At bedtime PRN   sildenafil (REVATIO) 20 MG tablet TAKE 3 TO 4 TABLETS BY MOUTH AT BEDTIME AS NEEDED   Vitamin D (Ergocalciferol) (DRISDOL) 50,000 Units, Oral, Every 7 days       Objective:   Physical Exam BP 124/82   Pulse 71   Temp 98.2 F (36.8 C) (Oral)   Resp 16   Ht 5\' 8"  (1.727 m)   Wt 175 lb 6 oz (79.5 kg)   SpO2 97%   BMI 26.67 kg/m  General:   Well developed, NAD, BMI noted.  HEENT:  Normocephalic . Face symmetric, atraumatic Abdomen:  Not distended, soft, non-tender. No rebound or rigidity.   Skin: Not pale. Not jaundice Lower extremities: no pretibial edema bilaterally  Neurologic:   alert & oriented X3.  Speech normal, gait appropriate for age and unassisted Psych--  Cognition and judgment appear intact.  Cooperative with normal attention span and concentration.  Behavior appropriate. No anxious or depressed appearing.     Assessment     Assessment Hyperglycemia: A1c 6.0 (2019). Hyperlipidemia: rx  statins 12/2022 H. pylori + gastritis per EGD 2009, S/P Rx GERD ED Varicocele. Re-eaval by urology 2016, [place] confirmed dx, L>R FH CAD Coronary calcium score (12-2022) was 168, saw cards, treadmill stress test (-)  PLAN: Rectal bleeding: As described above, went to the ER, CBC was stable, rectal exam (by ER MD)  showed brown stools with no blood.  No further episodes. Plan: Keep appointment with GI, likely a benign distal condition.  If symptoms come back he needs to let us know. High cholesterol: LDL on 12-2022 was 205, also, coronary calcium score was 168.  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 Reports 60% compliance with atorvastatin, has improved his exercise. We had a long conversation about an appropriate diet, most likely he will need atorvastatin for the rest of his life. Will check FLP AST ALT in 4 weeks giving him a chance to increase compliance. Vitamin D deficiency: On  weekly supplements OTC, recommend daily 2000 units, check vitamin D in 1 month. ED: Refill sildenafil GERD: Symptoms are minimal, only early in the morning, not taking PPIs for the last few weeks. RTC labs 1 month RTC CPX 12-2023

## 2023-07-11 NOTE — Patient Instructions (Signed)
 See gastroenterologist as recommended  If you have more bleeding call immediately  Be sure you take your cholesterol medication every day  Once you finish your weekly vitamin D, take daily vitamin D over-the-counter: 2000 units a day.     Please go to the front desk: Make an appointment for fasting blood work in 1 month from now Make an appointment for a physical exam by 12-2023.

## 2023-07-12 NOTE — Assessment & Plan Note (Signed)
 Rectal bleeding: As described above, went to the ER, CBC was stable, rectal exam (by ER MD)  showed brown stools with no blood.  No further episodes. Plan: Keep appointment with GI, likely a benign distal condition.  If symptoms come back he needs to let us know. High cholesterol: LDL on 12-2022 was 205, also, coronary calcium score was 168.  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 Reports 60% compliance with atorvastatin, has improved his exercise. We had a long conversation about an appropriate diet, most likely he will need atorvastatin for the rest of his life. Will check FLP AST ALT in 4 weeks giving him a chance to increase compliance. Vitamin D deficiency: On weekly supplements OTC, recommend daily 2000 units, check vitamin D in 1 month. ED: Refill sildenafil GERD: Symptoms are minimal, only early in the morning, not taking PPIs for the last few weeks. RTC labs 1 month RTC CPX 12-2023

## 2023-08-13 ENCOUNTER — Other Ambulatory Visit

## 2023-08-21 NOTE — Progress Notes (Deleted)
 Chief Complaint: Primary GI MD: Dr. Orvan Falconer  HPI: 53 year old male with medical history as listed below presents for evaluation of  Seen September 2023 by Quentin Mulling, PA-C.  At that time was reporting GERD with history of EGD November 2021 revealing esophagitis and gastric mucosal changes negative for H. pylori.  Was doing well on Nexium twice daily  Recently seen in ED 07/06/2023 with bright red blood per rectum no obvious abnormality per EDP rectal exam.  Labs with no anemia.  Discussed the use of AI scribe software for clinical note transcription with the patient, who gave verbal consent to proceed.  History of Present Illness      PREVIOUS GI WORKUP   11/2019 AB [place] for GERD fatty liver, GB unremarkable. 03/2020 colonoscopy  TA x 1, SSP x 3 due 03/2023 03/10/20 EGD revealed esophagitis and gastric mucosal changes. Biopsies were negative for H pylori. Some concern for taking the PPI long term, symptoms improve on twice a day. Feels nexium better than protonix.   Past Medical History:  Diagnosis Date   Abnormal EKG    Dyslipidemia    ED (erectile dysfunction)    Family history of heart disease    Gastritis    EGD 08/2007, H.Pylori positive, status post treatment   GERD (gastroesophageal reflux disease)    Hyperglycemia    Low back pain    Tinnitus     Past Surgical History:  Procedure Laterality Date   TONSILLECTOMY      Current Outpatient Medications  Medication Sig Dispense Refill   atorvastatin (LIPITOR) 40 MG tablet Take 1 tablet (40 mg total) by mouth at bedtime. 90 tablet 3   Cholecalciferol (VITAMIN D3) 25 MCG (1000 UT) CAPS Take 2 capsules (2,000 Units total) by mouth daily.     cyclobenzaprine (FLEXERIL) 10 MG tablet Take 1 tablet (10 mg total) by mouth daily as needed for muscle spasms. (Patient not taking: Reported on 07/11/2023) 30 tablet 1   esomeprazole (NEXIUM) 40 MG capsule Take 1 capsule (40 mg total) by mouth 2 (two) times daily before a meal.  (Patient not taking: Reported on 07/11/2023) 180 capsule 1   sildenafil (REVATIO) 20 MG tablet TAKE 3 TO 4 TABLETS BY MOUTH AT BEDTIME AS NEEDED 60 tablet 3   No current facility-administered medications for this visit.    Allergies as of 08/23/2023   (No Known Allergies)    Family History  Problem Relation Age of Onset   Diabetes Mother    Hypertension Mother    Coronary artery disease Mother        onset in her 66s, had an angioplasty in 61   Stroke Mother        ?   Heart attack Father        died at age 58, M I ?   Coronary artery disease Other        uncle   Colon cancer Neg Hx    Prostate cancer Neg Hx     Social History   Socioeconomic History   Marital status: Married    Spouse name: Not on file   Number of children: 1   Years of education: Not on file   Highest education level: Bachelor's degree (e.g., BA, AB, BS)  Occupational History   Occupation: Landscape architect   Tobacco Use   Smoking status: Some Days    Types: Cigarettes   Smokeless tobacco: Never  Vaping Use   Vaping status: Never Used  Substance  and Sexual Activity   Alcohol use: Yes    Comment: socially   Drug use: No   Sexual activity: Not on file  Other Topics Concern   Not on file  Social History Narrative   Originally from [place],    Married, 1 son, boy, 2005       Social Drivers of Health   Financial Resource Strain: Low Risk  (07/11/2023)   Overall Financial Resource Strain (CARDIA)    Difficulty of Paying Living Expenses: Not hard at all  Food Insecurity: No Food Insecurity (07/11/2023)   Hunger Vital Sign    Worried About Running Out of Food in the Last Year: Never true    Ran Out of Food in the Last Year: Never true  Transportation Needs: No Transportation Needs (07/11/2023)   PRAPARE - Administrator, Civil Service (Medical): No    Lack of Transportation (Non-Medical): No  Physical Activity: Sufficiently Active (07/11/2023)   Exercise Vital Sign    Days of  Exercise per Week: 5 days    Minutes of Exercise per Session: 50 min  Stress: Stress Concern Present (07/11/2023)   [employer] of Occupational Health - Occupational Stress Questionnaire    Feeling of Stress : Rather much  Social Connections: Moderately Isolated (07/11/2023)   Social Connection and Isolation Panel [NHANES]    Frequency of Communication with Friends and Family: Once a week    Frequency of Social Gatherings with Friends and Family: Once a week    Attends Religious Services: More than 4 times per year    Active Member of [employer] or Organizations: No    Attends Engineer, structural: Not on file    Marital Status: Married  Catering manager Violence: Not on file    Review of Systems:    Constitutional: No weight loss, fever, chills, weakness or fatigue HEENT: Eyes: No change in vision               Ears, Nose, Throat:  No change in hearing or congestion Skin: No rash or itching Cardiovascular: No chest pain, chest pressure or palpitations   Respiratory: No SOB or cough Gastrointestinal: See HPI and otherwise negative Genitourinary: No dysuria or change in urinary frequency Neurological: No headache, dizziness or syncope Musculoskeletal: No new muscle or joint pain Hematologic: No bleeding or bruising Psychiatric: No history of depression or anxiety    Physical Exam:  Vital signs: There were no vitals taken for this visit.  Constitutional: NAD, alert and cooperative Head:  Normocephalic and atraumatic. Eyes:   PEERL, EOMI. No icterus. Conjunctiva pink. Respiratory: Respirations even and unlabored. Lungs clear to auscultation bilaterally.   No wheezes, crackles, or rhonchi.  Cardiovascular:  Regular rate and rhythm. No peripheral edema, cyanosis or pallor.  Gastrointestinal:  Soft, nondistended, nontender. No rebound or guarding. Normal bowel sounds. No appreciable masses or hepatomegaly. Rectal:  Declines Msk:  Symmetrical without gross deformities.  Without edema, no deformity or joint abnormality.  Neurologic:  Alert and  oriented x4;  grossly normal neurologically.  Skin:   Dry and intact without significant lesions or rashes. Psychiatric: Oriented to person, place and time. Demonstrates good judgement and reason without abnormal affect or behaviors.  Physical Exam    RELEVANT LABS AND IMAGING: CBC    Component Value Date/Time   WBC 5.4 07/06/2023 1129   RBC 4.89 07/06/2023 1129   HGB 13.9 07/06/2023 1129   HCT 41.4 07/06/2023 1129   PLT 226 07/06/2023 1129  MCV 84.7 07/06/2023 1129   MCH 28.4 07/06/2023 1129   MCHC 33.6 07/06/2023 1129   RDW 13.0 07/06/2023 1129   LYMPHSABS 2.7 12/25/2022 1113   MONOABS 0.3 12/25/2022 1113   EOSABS 0.1 12/25/2022 1113   BASOSABS 0.0 12/25/2022 1113    CMP     Component Value Date/Time   NA 138 07/06/2023 1129   NA 136 (A) 11/18/2019 0000   K 3.8 07/06/2023 1129   CL 104 07/06/2023 1129   CO2 23 07/06/2023 1129   GLUCOSE 106 (H) 07/06/2023 1129   BUN 21 (H) 07/06/2023 1129   BUN 17 11/18/2019 0000   CREATININE 1.09 07/06/2023 1129   CALCIUM 9.6 07/06/2023 1129   PROT 7.5 07/06/2023 1129   ALBUMIN 4.7 07/06/2023 1129   AST 29 07/06/2023 1129   ALT 33 07/06/2023 1129   ALKPHOS 33 (L) 07/06/2023 1129   BILITOT 1.0 07/06/2023 1129   GFRNONAA >60 07/06/2023 1129   GFRAA 87 11/18/2019 0000     Assessment/Plan:   Assessment and Plan Assessment & Plan   Rectal bleeding Evaluated in ED February 2025.  Negative for anemia.  Negative rectal exam per EDP.    GERD On Nexium 40 Mg twice daily. 03/10/20 EGD revealed esophagitis and gastric mucosal changes. Biopsies were negative for H pylori.  History of colon polyps 03/2020 colonoscopy  TA x 1, SSP x 3 due 03/2023    Suzanna Erp, PA-C [facility] 08/21/2023, 12:43 PM  Cc: Ezell Hollow, MD

## 2023-08-23 ENCOUNTER — Ambulatory Visit: Payer: 59 | Admitting: Gastroenterology

## 2023-09-18 ENCOUNTER — Encounter: Payer: Self-pay | Admitting: *Deleted

## 2023-09-21 ENCOUNTER — Ambulatory Visit: Admitting: Gastroenterology

## 2023-09-21 ENCOUNTER — Encounter: Payer: Self-pay | Admitting: Gastroenterology

## 2023-09-21 VITALS — BP 118/76 | HR 56 | Ht 68.0 in | Wt 177.0 lb

## 2023-09-21 DIAGNOSIS — K219 Gastro-esophageal reflux disease without esophagitis: Secondary | ICD-10-CM

## 2023-09-21 DIAGNOSIS — R140 Abdominal distension (gaseous): Secondary | ICD-10-CM

## 2023-09-21 DIAGNOSIS — K625 Hemorrhage of anus and rectum: Secondary | ICD-10-CM | POA: Diagnosis not present

## 2023-09-21 DIAGNOSIS — Z860101 Personal history of adenomatous and serrated colon polyps: Secondary | ICD-10-CM

## 2023-09-21 DIAGNOSIS — Z860100 Personal history of colon polyps, unspecified: Secondary | ICD-10-CM

## 2023-09-21 NOTE — Patient Instructions (Addendum)
 It has been recommended to you by your physician that you have a colonoscopy completed. Per your request, we did not schedule the procedure(s) today. Please contact our office at [phone] should you decide to have the procedure completed. You will be scheduled for a pre-visit and procedure at that time.  You have been given a testing kit to check for small intestine bacterial overgrowth (SIBO) which is completed by a company named [facility]. Make sure to return your test in the mail using the return mailing label given to you along with the kit. The test order, your demographic and insurance information have all already been sent to the company. [facility] will collect an upfront charge of $99.74 for commercial insurance plans and $209.74 if you are paying cash. Make sure to discuss with [facility] PRIOR to having the test to see if they have gotten information from your insurance company as to how much your testing will cost out of pocket, if any. Please contact [facility] at phone number [phone] to get instructions regarding how to perform the test as our office is unable to give specific testing instructions.   Thank you for trusting me with your gastrointestinal care!   Suzanna Erp, PA  _______________________________________________________  If your blood pressure at your visit was 140/90 or greater, please contact your primary care physician to follow up on this.  _______________________________________________________  If you are age 28 or older, your body mass index should be between 23-30. Your Body mass index is 26.91 kg/m. If this is out of the aforementioned range listed, please consider follow up with your Primary Care Provider.  If you are age 51 or younger, your body mass index should be between 19-25. Your Body mass index is 26.91 kg/m. If this is out of the aformentioned range listed, please consider follow up with your Primary Care Provider.    ________________________________________________________  The [facility] providers would like to encourage you to use MYCHART to communicate with providers for non-urgent requests or questions.  Due to long hold times on the telephone, sending your provider a message by [facility] may be a faster and more efficient way to get a response.  Please allow 48 business hours for a response.  Please remember that this is for non-urgent requests.  _______________________________________________________

## 2023-09-21 NOTE — Progress Notes (Signed)
 Chief Complaint: Rectal bleeding Primary GI MD: Dr. Savannah Curlin  HPI: 53 year old male with medical history as listed below presents for evaluation of rectal bleeding  Seen September 2023 by Santina Cull, PA-C.  At that time was reporting GERD with history of EGD November 2021 revealing esophagitis and gastric mucosal changes negative for H. pylori.  Was doing well on Nexium  twice daily  Recently seen in ED 07/06/2023 with bright red blood per rectum no obvious abnormality per EDP rectal exam.  Labs with no anemia.  --------------TODAY------------------  Patient presents today to discuss his rectal bleeding episode from his emergency department visit 06/2023.  He states he has been trying to diet and exercise and adopt a healthier lifestyle.  He began taking liver supplements and soon after he had 1 day of rectal bleeding.  He did not think anything of it until 2 days later when he had another episode of rectal bleeding that filled the toilet and was more than just the usual bright red blood on tissue paper.  He called our office and since he had slight discomfort he was recommended to go to the ED with findings per above.  He has not had any further rectal bleeding since this incident and has stopped the liver supplements.  His last colonoscopy in 2021 showed 3 sessile serrated polyps and 1 tubular adenoma with a repeat recommended 03/2023.  Additionally he has stopped all of his GERD medicine.  His GERD is well-controlled but he does note worsening bloating over the last few months.  States he gets bloating with anything that he eats.  Denies constipation, straining with bowel movements, weight loss, family history of colon cancer  PREVIOUS GI WORKUP   11/2019 AB US  for GERD fatty liver, GB unremarkable. 03/2020 colonoscopy  TA x 1, SSP x 3 due 03/2023 03/10/20 EGD revealed esophagitis and gastric mucosal changes. Biopsies were negative for H pylori. Some concern for taking the PPI long  term, symptoms improve on twice a day. Feels nexium  better than protonix .   Past Medical History:  Diagnosis Date   Abnormal EKG    Dyslipidemia    ED (erectile dysfunction)    Family history of heart disease    Gastritis    EGD 08/2007, H.Pylori positive, status post treatment   GERD (gastroesophageal reflux disease)    Hyperglycemia    Low back pain    Tinnitus     Past Surgical History:  Procedure Laterality Date   TONSILLECTOMY      Current Outpatient Medications  Medication Sig Dispense Refill   atorvastatin  (LIPITOR) 40 MG tablet Take 1 tablet (40 mg total) by mouth at bedtime. 90 tablet 3   Cholecalciferol (VITAMIN D3) 25 MCG (1000 UT) CAPS Take 2 capsules (2,000 Units total) by mouth daily.     cyclobenzaprine  (FLEXERIL ) 10 MG tablet Take 1 tablet (10 mg total) by mouth daily as needed for muscle spasms. 30 tablet 1   sildenafil  (REVATIO ) 20 MG tablet TAKE 3 TO 4 TABLETS BY MOUTH AT BEDTIME AS NEEDED 60 tablet 3   esomeprazole  (NEXIUM ) 40 MG capsule Take 1 capsule (40 mg total) by mouth 2 (two) times daily before a meal. (Patient not taking: Reported on 09/21/2023) 180 capsule 1   No current facility-administered medications for this visit.    Allergies as of 09/21/2023   (No Known Allergies)    Family History  Problem Relation Age of Onset   Diabetes Mother    Hypertension Mother    Coronary  artery disease Mother        onset in her 78s, had an angioplasty in 55   Stroke Mother        ?   Heart attack Father        died at age 39, M I ?   Coronary artery disease Other        uncle   Colon cancer Neg Hx    Prostate cancer Neg Hx     Social History   Socioeconomic History   Marital status: Married    Spouse name: Not on file   Number of children: 1   Years of education: Not on file   Highest education level: Bachelor's degree (e.g., BA, AB, BS)  Occupational History   Occupation: Landscape architect   Tobacco Use   Smoking status: Some Days     Types: Cigarettes   Smokeless tobacco: Never  Vaping Use   Vaping status: Never Used  Substance and Sexual Activity   Alcohol use: Yes    Comment: socially   Drug use: No   Sexual activity: Not on file  Other Topics Concern   Not on file  Social History Narrative   Originally from [place],    Married, 1 son, boy, 2005       Social Drivers of Corporate investment banker Strain: Low Risk  (07/11/2023)   Overall Financial Resource Strain (CARDIA)    Difficulty of Paying Living Expenses: Not hard at all  Food Insecurity: No Food Insecurity (07/11/2023)   Hunger Vital Sign    Worried About Running Out of Food in the Last Year: Never true    Ran Out of Food in the Last Year: Never true  Transportation Needs: No Transportation Needs (07/11/2023)   PRAPARE - Administrator, Civil Service (Medical): No    Lack of Transportation (Non-Medical): No  Physical Activity: Sufficiently Active (07/11/2023)   Exercise Vital Sign    Days of Exercise per Week: 5 days    Minutes of Exercise per Session: 50 min  Stress: Stress Concern Present (07/11/2023)   [employer] of Occupational Health - Occupational Stress Questionnaire    Feeling of Stress : Rather much  Social Connections: Moderately Isolated (07/11/2023)   Social Connection and Isolation Panel [NHANES]    Frequency of Communication with Friends and Family: Once a week    Frequency of Social Gatherings with Friends and Family: Once a week    Attends Religious Services: More than 4 times per year    Active Member of [employer] or Organizations: No    Attends Engineer, structural: Not on file    Marital Status: Married  Catering manager Violence: Not on file    Review of Systems:    Constitutional: No weight loss, fever, chills, weakness or fatigue HEENT: Eyes: No change in vision               Ears, Nose, Throat:  No change in hearing or congestion Skin: No rash or itching Cardiovascular: No chest pain, chest pressure  or palpitations   Respiratory: No SOB or cough Gastrointestinal: See HPI and otherwise negative Genitourinary: No dysuria or change in urinary frequency Neurological: No headache, dizziness or syncope Musculoskeletal: No new muscle or joint pain Hematologic: No bleeding or bruising Psychiatric: No history of depression or anxiety    Physical Exam:  Vital signs: BP 118/76   Pulse (!) 56   Ht 5\' 8"  (1.727 m)  Wt 177 lb (80.3 kg)   BMI 26.91 kg/m   Constitutional: NAD, alert and cooperative Head:  Normocephalic and atraumatic. Eyes:   PEERL, EOMI. No icterus. Conjunctiva pink. Respiratory: Respirations even and unlabored. Lungs clear to auscultation bilaterally.   No wheezes, crackles, or rhonchi.  Cardiovascular:  Regular rate and rhythm. No peripheral edema, cyanosis or pallor.  Gastrointestinal:  Soft, nondistended, nontender. No rebound or guarding. Normal bowel sounds. No appreciable masses or hepatomegaly. Rectal:  Declines Msk:  Symmetrical without gross deformities. Without edema, no deformity or joint abnormality.  Neurologic:  Alert and  oriented x4;  grossly normal neurologically.  Skin:   Dry and intact without significant lesions or rashes. Psychiatric: Oriented to person, place and time. Demonstrates good judgement and reason without abnormal affect or behaviors.  Physical Exam    RELEVANT LABS AND IMAGING: CBC    Component Value Date/Time   WBC 5.4 07/06/2023 1129   RBC 4.89 07/06/2023 1129   HGB 13.9 07/06/2023 1129   HCT 41.4 07/06/2023 1129   PLT 226 07/06/2023 1129   MCV 84.7 07/06/2023 1129   MCH 28.4 07/06/2023 1129   MCHC 33.6 07/06/2023 1129   RDW 13.0 07/06/2023 1129   LYMPHSABS 2.7 12/25/2022 1113   MONOABS 0.3 12/25/2022 1113   EOSABS 0.1 12/25/2022 1113   BASOSABS 0.0 12/25/2022 1113    CMP     Component Value Date/Time   NA 138 07/06/2023 1129   NA 136 (A) 11/18/2019 0000   K 3.8 07/06/2023 1129   CL 104 07/06/2023 1129   CO2 23  07/06/2023 1129   GLUCOSE 106 (H) 07/06/2023 1129   BUN 21 (H) 07/06/2023 1129   BUN 17 11/18/2019 0000   CREATININE 1.09 07/06/2023 1129   CALCIUM  9.6 07/06/2023 1129   PROT 7.5 07/06/2023 1129   ALBUMIN 4.7 07/06/2023 1129   AST 29 07/06/2023 1129   ALT 33 07/06/2023 1129   ALKPHOS 33 (L) 07/06/2023 1129   BILITOT 1.0 07/06/2023 1129   GFRNONAA >60 07/06/2023 1129   GFRAA 87 11/18/2019 0000     Assessment/Plan:   Rectal bleeding Evaluated in ED February 2025.  Negative for anemia.  Negative rectal exam per EDP.  Colonoscopy 2021 with 3 sessile serrated polyps and 1 tubular adenoma.   2 episodes of rectal bleeding while taking liver supplement, no further bleeding since stopping with liver supplement.  No straining with bowel movements.  No diverticulosis on previous colonoscopy.  Possible hemorrhoids but will need further evaluation. - Will need to evaluate with colonoscopy - I thoroughly discussed the procedure with the patient (at bedside) to include nature of the procedure, alternatives, benefits, and risks (including but not limited to bleeding, infection, perforation, anesthesia/cardiac pulmonary complications).  Patient verbalized understanding and gave verbal consent to proceed with procedure.   GERD Previously Nexium  40 Mg twice daily. 03/10/20 EGD revealed esophagitis and gastric mucosal changes. Biopsies were negative for H pylori.  No longer on Nexium  with diet control of GERD  Bloating Bloating over the past few months.  Could be related to discontinuing Nexium  but notes bloating with anything he eats not just specific foods.  Patient would prefer not to be on PPI or H2 blocker - Discussed low FODMAP diet and provided patient education handout - Discussed GERD diet and provided handout - SIBO breath testing bloating  History of colon polyps 03/2020 colonoscopy  TA x 1, SSP x 3 due 03/2023 -Schedule colonoscopy  Assigned to Dr. Cherryl Corona (he will call  back to  schedule colonoscopy)   This visit required 35 minutes of patient care (this includes precharting, chart review, review of results, face-to-face time used for counseling as well as treatment plan and follow-up. The patient was provided an opportunity to ask questions and all were answered. The patient agreed with the plan and demonstrated an understanding of the instructions.   Gigi [facility] 09/21/2023, 9:37 AM  Cc: Ezell Hollow, MD

## 2023-10-07 NOTE — Progress Notes (Signed)
 Agree with the assessment and plan as outlined by Boone Master, PA-C.

## 2023-10-08 ENCOUNTER — Telehealth: Payer: Self-pay

## 2023-10-08 NOTE — Telephone Encounter (Signed)
 Copied from CRM [id number]. Topic: Clinical - Medication Question >> Oct 08, 2023  2:11 PM [place] P wrote: Reason for CRM:  pt would like atorvastatin  (LIPITOR) 40 MG tablet 90 DS instead of 30DS

## 2023-10-08 NOTE — Telephone Encounter (Signed)
 This is prescribed by Dr. Renna Cary- last prescription sent was a 90 day w/ refills.

## 2023-12-28 ENCOUNTER — Encounter: Payer: Self-pay | Admitting: Internal Medicine

## 2023-12-28 ENCOUNTER — Ambulatory Visit (INDEPENDENT_AMBULATORY_CARE_PROVIDER_SITE_OTHER): Admitting: Internal Medicine

## 2023-12-28 VITALS — BP 116/66 | HR 64 | Temp 97.8°F | Resp 16 | Ht 68.0 in | Wt 180.0 lb

## 2023-12-28 DIAGNOSIS — Z0000 Encounter for general adult medical examination without abnormal findings: Secondary | ICD-10-CM

## 2023-12-28 DIAGNOSIS — E785 Hyperlipidemia, unspecified: Secondary | ICD-10-CM

## 2023-12-28 DIAGNOSIS — R739 Hyperglycemia, unspecified: Secondary | ICD-10-CM | POA: Diagnosis not present

## 2023-12-28 DIAGNOSIS — Z0001 Encounter for general adult medical examination with abnormal findings: Secondary | ICD-10-CM | POA: Diagnosis not present

## 2023-12-28 DIAGNOSIS — R5383 Other fatigue: Secondary | ICD-10-CM

## 2023-12-28 DIAGNOSIS — Z125 Encounter for screening for malignant neoplasm of prostate: Secondary | ICD-10-CM | POA: Diagnosis not present

## 2023-12-28 DIAGNOSIS — Z23 Encounter for immunization: Secondary | ICD-10-CM | POA: Diagnosis not present

## 2023-12-28 DIAGNOSIS — E559 Vitamin D deficiency, unspecified: Secondary | ICD-10-CM | POA: Diagnosis not present

## 2023-12-28 DIAGNOSIS — Z0189 Encounter for other specified special examinations: Secondary | ICD-10-CM

## 2023-12-28 DIAGNOSIS — R0683 Snoring: Secondary | ICD-10-CM | POA: Diagnosis not present

## 2023-12-28 DIAGNOSIS — R931 Abnormal findings on diagnostic imaging of heart and coronary circulation: Secondary | ICD-10-CM

## 2023-12-28 DIAGNOSIS — Z8249 Family history of ischemic heart disease and other diseases of the circulatory system: Secondary | ICD-10-CM

## 2023-12-28 LAB — LIPID PANEL
Cholesterol: 123 mg/dL (ref 0–200)
HDL: 39.3 mg/dL (ref >39.00)
LDL Cholesterol: 58 mg/dL (ref 0–99)
NonHDL: 83.63
Total CHOL/HDL Ratio: 3
Triglycerides: 129 mg/dL (ref 0.0–149.0)
VLDL: 25.8 mg/dL (ref 0.0–40.0)

## 2023-12-28 LAB — COMPREHENSIVE METABOLIC PANEL WITH GFR
ALT: 30 U/L (ref 0–53)
AST: 18 U/L (ref 0–37)
Albumin: 4.8 g/dL (ref 3.5–5.2)
Alkaline Phosphatase: 39 U/L (ref 39–117)
BUN: 16 mg/dL (ref 6–23)
CO2: 29 meq/L (ref 19–32)
Calcium: 9.4 mg/dL (ref 8.4–10.5)
Chloride: 102 meq/L (ref 96–112)
Creatinine, Ser: 1.2 mg/dL (ref 0.40–1.50)
GFR: 70.42 mL/min (ref >60.00)
Glucose, Bld: 96 mg/dL (ref 70–99)
Potassium: 3.9 meq/L (ref 3.5–5.1)
Sodium: 139 meq/L (ref 135–145)
Total Bilirubin: 0.6 mg/dL (ref 0.2–1.2)
Total Protein: 7 g/dL (ref 6.0–8.3)

## 2023-12-28 LAB — VITAMIN D 25 HYDROXY (VIT D DEFICIENCY, FRACTURES): VITD: 61.19 ng/mL (ref 30.00–100.00)

## 2023-12-28 LAB — PSA: PSA: 0.54 ng/mL (ref 0.10–4.00)

## 2023-12-28 LAB — HEMOGLOBIN A1C: Hgb A1c MFr Bld: 6.6 % — ABNORMAL HIGH (ref 4.6–6.5)

## 2023-12-28 NOTE — Patient Instructions (Signed)
 Vaccines I recommend: Flu shot this fall Consider COVID booster  Exercise at least 3 hours a week.  GO TO THE LAB :  Get the blood work   Your results will be posted on MyChart with my comments  Go to the front desk for the checkout Please make an appointment for a checkup in 6 months

## 2023-12-28 NOTE — Progress Notes (Signed)
 Subjective:    Patient ID: Merrianne DELENA Custard, male    DOB: 05-27-1970, 53 y.o.   MRN: [id number]  DOS:  12/28/2023 Type of visit - description: CPX  Here for CPX. Chronic medical problems addressed. For the last week has been having some cough and chest congestion as well as sore throat. Only 1 time saw some yellowish sputum. No fever or chills.  No chest pain no difficulty breathing.  No sneezing.  Other than denies any GI or GU symptoms. Occasional feels sluggish, uses mouthguard for snoring.  Review of Systems  Other than above, a 14 point review of systems is negative        Past Medical History:  Diagnosis Date   Abnormal EKG    Dyslipidemia    ED (erectile dysfunction)    Family history of heart disease    Gastritis    EGD 08/2007, H.Pylori positive, status post treatment   GERD (gastroesophageal reflux disease)    Hyperglycemia    Low back pain    Tinnitus     Past Surgical History:  Procedure Laterality Date   TONSILLECTOMY     Social History   Socioeconomic History   Marital status: Married    Spouse name: Not on file   Number of children: 1   Years of education: Not on file   Highest education level: Bachelor's degree (e.g., BA, AB, BS)  Occupational History   Occupation: Landscape architect   Tobacco Use   Smoking status: Never   Smokeless tobacco: Never  Vaping Use   Vaping status: Never Used  Substance and Sexual Activity   Alcohol use: Yes    Comment: socially   Drug use: No   Sexual activity: Not on file  Other Topics Concern   Not on file  Social History Narrative   Originally from [place],    Married, 1 son, boy, 2005       Social Drivers of Corporate investment banker Strain: Low Risk  (12/21/2023)   Overall Financial Resource Strain (CARDIA)    Difficulty of Paying Living Expenses: Not hard at all  Food Insecurity: No Food Insecurity (12/21/2023)   Hunger Vital Sign    Worried About Running Out of Food in the Last Year:  Never true    Ran Out of Food in the Last Year: Never true  Transportation Needs: No Transportation Needs (12/21/2023)   PRAPARE - Administrator, Civil Service (Medical): No    Lack of Transportation (Non-Medical): No  Physical Activity: Sufficiently Active (12/21/2023)   Exercise Vital Sign    Days of Exercise per Week: 4 days    Minutes of Exercise per Session: 60 min  Stress: Stress Concern Present (12/21/2023)   [employer] of Occupational Health - Occupational Stress Questionnaire    Feeling of Stress: To some extent  Social Connections: Moderately Integrated (12/21/2023)   Social Connection and Isolation Panel    Frequency of Communication with Friends and Family: Three times a week    Frequency of Social Gatherings with Friends and Family: Once a week    Attends Religious Services: More than 4 times per year    Active Member of [employer] or Organizations: No    Attends Engineer, structural: Not on file    Marital Status: Married  Catering manager Violence: Not on file    Current Outpatient Medications  Medication Instructions   atorvastatin  (LIPITOR) 40 mg, Oral, Daily at bedtime   cyclobenzaprine  (  FLEXERIL ) 10 mg, Oral, Daily PRN   sildenafil  (REVATIO ) 20 MG tablet TAKE 3 TO 4 TABLETS BY MOUTH AT BEDTIME AS NEEDED   Vitamin D3 2,000 Units, Oral, Daily       Objective:   Physical Exam BP 116/66   Pulse 64   Temp 97.8 F (36.6 C) (Oral)   Resp 16   Ht 5' 8 (1.727 m)   Wt 180 lb (81.6 kg)   SpO2 96%   BMI 27.37 kg/m  General: Well developed, NAD, BMI noted Neck: No  thyromegaly  HEENT:  Normocephalic . Face symmetric, atraumatic Lungs:  Few rhonchi Normal respiratory effort, no intercostal retractions, no accessory muscle use. Heart: RRR,  no murmur.  Abdomen:  Not distended, soft, non-tender. No rebound or rigidity.   Lower extremities: no pretibial edema bilaterally  Skin: Exposed areas without rash. Not pale. Not  jaundice Neurologic:  alert & oriented X3.  Speech normal, gait appropriate for age and unassisted Strength symmetric and appropriate for age.  Psych: Cognition and judgment appear intact.  Cooperative with normal attention span and concentration.  Behavior appropriate. No anxious or depressed appearing.     Assessment   Assessment Hyperglycemia: A1c 6.0 (2019). Hyperlipidemia: rx  statins 12/2022 FH CAD-- Coronary calcium  score (12-2022) was 168, saw cards, treadmill stress test (-) GERD ED Vit D def Varicocele. Re-eaval by urology 2016, US  confirmed dx, L>R H. pylori + gastritis per EGD 2009, S/P Rx  PLAN: Here for CPX Tdap today Vaccines I recommend:  flu shot every fall, COVID booster   CCS: Cscope 03-2020, Saw GI, planning to schedule a colonoscopy soon. Prostate cancer screening: Asymptomatic, check PSA. Labs: BMP FLP CBC A1c TSH Diet exercise: Encouraged at least 3 hours of exercise weekly.  Other issues addressed URI: Symptoms started a week ago, feeling better, only few rhonchi's on exam.  Recommend to continue Mucinex DM and call if not better. Hyperglycemia: Encouraged healthy lifestyle.  Check A1c High cholesterol: Good compliance with atorvastatin , checking labs. + Coronary calcium  score: On aspirin, statins. Rectal bleeding: See LOV, subsequently saw GI May 2025,.  Was rec colonoscopy (pending) OSA?  New issue, occasional feels sluggish, uses a mouthguard due to snoring.  Epworth scale: 16, elevated.  This was discussed with the patient, and we agreed on neurology evaluation GERD: GI noted to be diet controlled. Bloating: Saw GI, following a diet as rec by GI  RTC 6 months

## 2023-12-30 ENCOUNTER — Encounter: Payer: Self-pay | Admitting: Internal Medicine

## 2023-12-30 DIAGNOSIS — R931 Abnormal findings on diagnostic imaging of heart and coronary circulation: Secondary | ICD-10-CM | POA: Insufficient documentation

## 2023-12-30 NOTE — Assessment & Plan Note (Signed)
 Here for CPX   Other issues addressed URI: Symptoms started a week ago, feeling better, only few rhonchi's on exam.  Recommend to continue Mucinex DM and call if not better. Hyperglycemia: Encouraged healthy lifestyle.  Check A1c High cholesterol: Good compliance with atorvastatin , checking labs. + Coronary calcium  score: On aspirin, statins. Rectal bleeding: See LOV, subsequently saw GI May 2025,.  Was rec colonoscopy (pending) OSA?  New issue, occasional feels sluggish, uses a mouthguard due to snoring.  Epworth scale: 16, elevated.  This was discussed with the patient, and we agreed on neurology evaluation GERD: GI noted to be diet controlled. Bloating: Saw GI, following a diet as rec by GI  RTC 6 months

## 2023-12-30 NOTE — Assessment & Plan Note (Signed)
 Here for CPX Tdap today Vaccines I recommend:  flu shot every fall, COVID booster   CCS: Cscope 03-2020, Saw GI, planning to schedule a colonoscopy soon. Prostate cancer screening: Asymptomatic, check PSA. Labs: BMP FLP CBC A1c TSH Diet exercise: Encouraged at least 3 hours of exercise weekly.

## 2024-01-01 ENCOUNTER — Ambulatory Visit: Payer: Self-pay | Admitting: Internal Medicine

## 2024-01-01 DIAGNOSIS — E119 Type 2 diabetes mellitus without complications: Secondary | ICD-10-CM | POA: Insufficient documentation

## 2024-01-01 MED ORDER — METFORMIN HCL ER 500 MG PO TB24: 1000.0000 mg | ORAL_TABLET | Freq: Every day | ORAL | 4 refills | Status: AC

## 2024-03-30 ENCOUNTER — Other Ambulatory Visit: Payer: Self-pay | Admitting: Cardiology

## 2024-04-01 ENCOUNTER — Other Ambulatory Visit: Payer: Self-pay | Admitting: Cardiology

## 2024-04-08 NOTE — Telephone Encounter (Signed)
 Sent in refill

## 2024-04-19 ENCOUNTER — Other Ambulatory Visit: Payer: Self-pay | Admitting: Cardiology

## 2024-04-30 ENCOUNTER — Telehealth: Admitting: Nurse Practitioner

## 2024-04-30 DIAGNOSIS — B029 Zoster without complications: Secondary | ICD-10-CM | POA: Diagnosis not present

## 2024-04-30 MED ORDER — VALACYCLOVIR HCL 1 G PO TABS: 1000.0000 mg | ORAL_TABLET | Freq: Three times a day (TID) | ORAL | 0 refills | Status: AC

## 2024-04-30 NOTE — Progress Notes (Signed)
 E-visit for Shingles   We are sorry that you are not feeling well. Here is how we plan to help!  Giving concerning and classic symptoms, it looks like you have shingles.  Shingles or herpes zoster, is a common infection of the nerves.  It is a painful rash caused by the herpes zoster virus.  This is the same virus that causes chickenpox.  After a person has chickenpox, the virus remains inactive in the nerve cells.  Years later, the virus can become active again and travel to the skin.  It typically will appear on one side of the face or body.  Burning or shooting pain, tingling, or itching are early signs of the infection.  Blisters typically scab over in 7 to 10 days and clear up within 2-4 weeks. Shingles is only contagious to people that have never had the chickenpox, the chickenpox vaccine, or anyone who has a compromised immune system.  You should avoid contact with these type of people until your blisters scab over.  I have prescribed Valacyclovir  1g three times daily for 7 days.   HOME CARE: Apply ice packs (wrapped in a thin towel), cool compresses, or soak in cool bath to help reduce pain. Use calamine lotion to calm itchy skin. Avoid scratching the rash. Avoid direct sunlight.  GET HELP RIGHT AWAY IF: Symptoms that dont away after treatment. A rash or blisters near your eye. Increased drainage, fever, or rash after treatment. Severe pain that doesnt go away.   MAKE SURE YOU   Understand these instructions. Will watch your condition. Will get help right away if you are not doing well or get worse.  Thank you for choosing an e-visit. Your e-visit answers were reviewed by a board certified advanced clinical practitioner to complete your personal care plan. Depending upon the condition, your plan could have included both over the counter or prescription medications.  Please review your pharmacy choice. Make sure the pharmacy is open so you can pick up prescription now. If  there is a problem, you may contact your provider through [employer] and have the prescription routed to another pharmacy.  Your safety is important to us . If you have drug allergies check your prescription carefully.   For the next 24 hours you can use MyChart to ask questions about todays visit, request a non-urgent call back, or ask for a work or school excuse.  You will get an email in the next two days asking about your experience. I hope that your e-visit has been valuable and will speed your recovery  I have spent 5 minutes in review of e-visit questionnaire, review and updating patient chart, medical decision making and response to patient.   Elsie Velma Lunger, PA-C

## 2024-05-03 ENCOUNTER — Other Ambulatory Visit: Payer: Self-pay

## 2024-05-03 ENCOUNTER — Encounter (HOSPITAL_BASED_OUTPATIENT_CLINIC_OR_DEPARTMENT_OTHER): Payer: Self-pay | Admitting: Emergency Medicine

## 2024-05-03 ENCOUNTER — Emergency Department (HOSPITAL_BASED_OUTPATIENT_CLINIC_OR_DEPARTMENT_OTHER)
Admission: EM | Admit: 2024-05-03 | Discharge: 2024-05-03 | Disposition: A | Discharge status: Home / Self Care | Attending: Emergency Medicine | Admitting: Emergency Medicine

## 2024-05-03 DIAGNOSIS — H9203 Otalgia, bilateral: Secondary | ICD-10-CM | POA: Diagnosis present

## 2024-05-03 MED ORDER — CIPROFLOXACIN-DEXAMETHASONE 0.3-0.1 % OT SUSP: 4.0000 [drp] | Freq: Two times a day (BID) | OTIC | 0 refills | Status: AC

## 2024-05-03 NOTE — ED Notes (Signed)
 ED Provider at bedside.

## 2024-05-03 NOTE — ED Triage Notes (Signed)
 Drainage from right ear  Noticed last night Denies pain in right ear Currently has shingle on left side on antiviral Denies any hearing changes

## 2024-05-03 NOTE — Progress Notes (Signed)
" °  Because you have drainage from your ear, you will need to have someone examine the inside of your ear to make sure you do not have a ruptured ear drum or infection in your ear, I feel your condition warrants further evaluation and I recommend that you be seen in a face-to-face visit.   NOTE: There will be NO CHARGE for this E-Visit   If you are having a true medical emergency, please call 911.     For an urgent face to face visit, [facility] has multiple urgent care centers for your convenience.  Click the link below for the full list of locations and hours, walk-in wait times, appointment scheduling options and driving directions:  Urgent Care - [place], [place], [place], [place], [place], [place]  [place]     Your MyChart E-visit questionnaire answers were reviewed by a board certified advanced clinical practitioner to complete your personal care plan based on your specific symptoms.    Thank you for using e-Visits.    "

## 2024-05-03 NOTE — Discharge Instructions (Addendum)
 You may use ciprofloxacin  eardrops a few times per day.  Follow-up with your primary care doctor.

## 2024-05-03 NOTE — ED Provider Notes (Signed)
 " [place] EMERGENC[facility] DEPARTMENT AT [facility] Provider Note   CSN: [id number] Arrival date & time: 05/03/24  1954     Patient presents with: Ear Drainage   Scott Sexton is a 53 y.o. male.   This is a 53 year old male who is here today for bilateral ear pain and discharge.  He is currently being treated for shingles by his PCP with valacyclovir .  His shingles rash is in a radial nerve distribution.   Ear Drainage       Prior to Admission medications  Medication Sig Start Date End Date Taking? Authorizing Provider  ciprofloxacin -dexamethasone  (CIPRODEX ) OTIC suspension Place 4 drops into both ears 2 (two) times daily. 05/03/24  Yes Mannie Pac T, DO  atorvastatin  (LIPITOR) 40 MG tablet TAKE 1 TABLET(40 MG) BY MOUTH DAILY 04/22/24   Jeffrie Oneil BROCKS, MD  Cholecalciferol (VITAMIN D3) 25 MCG (1000 UT) CAPS Take 2 capsules (2,000 Units total) by mouth daily. 07/11/23   Amon Aloysius BRAVO, MD  cyclobenzaprine  (FLEXERIL ) 10 MG tablet Take 1 tablet (10 mg total) by mouth daily as needed for muscle spasms. 08/08/22   Paz, Jose E, MD  metFORMIN  (GLUCOPHAGE -XR) 500 MG 24 hr tablet Take 2 tablets (1,000 mg total) by mouth daily with breakfast. 01/01/24   Paz, Jose E, MD  sildenafil  (REVATIO ) 20 MG tablet TAKE 3 TO 4 TABLETS BY MOUTH AT BEDTIME AS NEEDED 07/11/23   Paz, Jose E, MD  valACYclovir  (VALTREX ) 1000 MG tablet Take 1 tablet (1,000 mg total) by mouth 3 (three) times daily for 7 days. 04/30/24 05/07/24  Gladis Elsie BROCKS, PA-C    Allergies: Patient has no known allergies.    Review of Systems  Updated Vital Signs BP (!) 156/94 (BP Location: Right Arm)   Pulse 68   Temp (!) 97.5 F (36.4 C)   Resp 16   SpO2 98%   Physical Exam Vitals and nursing note reviewed.  Constitutional:      Appearance: He is not toxic-appearing.  HENT:     Head:     Comments: There is some crusting of the right external auditory canal.  Left external auditory canal normal.    Right Ear: There is  impacted cerumen.     Left Ear: There is impacted cerumen.  Skin:    Comments: Zoster rash in the C7 dermatome on the left  Neurological:     Mental Status: He is alert.     (all labs ordered are listed, but only abnormal results are displayed) Labs Reviewed - No data to display  EKG: None  Radiology: No results found.   Procedures   Medications Ordered in the ED - No data to display                                  Medical Decision Making 53 year old male here today for ear pain and discharge.  Differential diagnoses include otitis externa, consider Ramsay Hunt.  Plan-patient with impacted cerumen bilaterally.  However does not appear to be an obvious otitis media.  He does have some mild crusting in the right external auditory canal but is not consistent with a zoster rash.  I do think there is little harm in treating the patient with eardrops.  Will discharge.        Final diagnoses:  Ear pain, bilateral    ED Discharge Orders          Ordered  ciprofloxacin -dexamethasone  (CIPRODEX ) OTIC suspension  2 times daily        05/03/24 2225               Mannie Pac T, DO 05/03/24 2226  "

## 2024-05-04 ENCOUNTER — Telehealth

## 2024-05-21 ENCOUNTER — Other Ambulatory Visit: Payer: Self-pay | Admitting: Internal Medicine

## 2024-05-21 NOTE — Telephone Encounter (Signed)
 Error CRM sent. Atorvastatin  40mg  prescribed by Dr Jeffrie.

## 2024-05-21 NOTE — Telephone Encounter (Deleted)
 Dena- will you let him know I've sent a refill however he is due for a follow-up with Dr. Amon. Will you schedule please?

## 2024-05-21 NOTE — Telephone Encounter (Signed)
 Copied from CRM [id number]. Topic: Clinical - Medication Refill >> May 21, 2024  2:49 PM Berneda F wrote: Medication:  atorvastatin  (LIPITOR) 40 MG tablet  Patient would like a 90-day supply instead of 30-day  Has the patient contacted their pharmacy? Yes (Agent: If no, request that the patient contact the pharmacy for the refill. If patient does not wish to contact the pharmacy document the reason why and proceed with request.) (Agent: If yes, when and what did the pharmacy advise?)  This is the patient's preferred pharmacy:  [id number] [address], [place] - [address] AT [facility] [address] [place] [address] Phone: [phone] Fax: [phone]  Is this the correct pharmacy for this prescription? Yes If no, delete pharmacy and type the correct one.   Has the prescription been filled recently? No  Is the patient out of the medication? No  Has the patient been seen for an appointment in the last year OR does the patient have an upcoming appointment? Yes  Can we respond through MyChart? Yes  Agent: Please be advised that Rx refills may take up to 3 business days. We ask that you follow-up with your pharmacy.

## 2024-05-23 NOTE — Telephone Encounter (Signed)
 Allred, Scott Sexton   05/23/2024  1:21 PM  Thank you for submitting this issue for investigation. After a thorough review, we have determined that an error was made by an E2C2 team member. We have addressed the matter directly with the agent involved. We appreciate you bringing this to our attention.   Error/Investigation Details: Pulled call. Call ID/JTAPI ID: [id number]. Patient called in stating that he wanted to see if Dr. Amon could changed his medication from a 30 day supply to a 90 day supply. Patient stated that he was in the pharmacy line now waiting to pick up his medication: atorvastatin  (LIPITOR) 40 MG tablet. The specialist asked the patient if the pharmacy called him to let him know his medication was ready for pick up and he said yes, it was the refill from December that he was picking up. The specialist did review the chart but did not notice the medication was ordered by Dr. Oneil Parchment. Specialist should have advised patient that he would need to reach out to Dr. Oneil Parchment office.

## 2024-06-04 ENCOUNTER — Telehealth: Payer: Self-pay | Admitting: Internal Medicine

## 2024-06-04 NOTE — Telephone Encounter (Signed)
 Copied from CRM [id number]. Topic: Referral - Question >> Jun 04, 2024  1:47 PM Deleta RAMAN wrote: Reason for CRM: patient would like to know what doctor office was he referred to for colonoscopy. Please reply via mychart or phone

## 2024-06-04 NOTE — Telephone Encounter (Signed)
 Please inform Pt he has not been referred since 2024- he will need a new referral. He is overdue for a follow-up with Dr. Amon. Please schedule. Thank you.

## 2024-06-06 ENCOUNTER — Ambulatory Visit: Admitting: Internal Medicine

## 2024-06-06 NOTE — Telephone Encounter (Signed)
 Pt is scheduled

## 2024-06-27 ENCOUNTER — Ambulatory Visit: Admitting: Gastroenterology
